# Patient Record
Sex: Male | Born: 1975 | Race: White | Hispanic: No | Marital: Married | Smoking: Never smoker
Health system: Southern US, Community
[De-identification: ages and names within clinical notes are randomized; demographics above are authoritative.]

## PROBLEM LIST (undated history)

## (undated) DIAGNOSIS — N200 Calculus of kidney: Secondary | ICD-10-CM

## (undated) DIAGNOSIS — L509 Urticaria, unspecified: Secondary | ICD-10-CM

## (undated) DIAGNOSIS — E7800 Pure hypercholesterolemia, unspecified: Secondary | ICD-10-CM

## (undated) DIAGNOSIS — I10 Essential (primary) hypertension: Secondary | ICD-10-CM

## (undated) HISTORY — DX: Urticaria, unspecified: L50.9

---

## 2000-08-26 ENCOUNTER — Emergency Department (HOSPITAL_COMMUNITY): Admission: EM | Admit: 2000-08-26 | Discharge: 2000-08-26 | Payer: Self-pay

## 2000-08-26 ENCOUNTER — Encounter: Payer: Self-pay | Admitting: Emergency Medicine

## 2001-01-28 ENCOUNTER — Emergency Department (HOSPITAL_COMMUNITY): Admission: EM | Admit: 2001-01-28 | Discharge: 2001-01-28 | Payer: Self-pay | Admitting: Emergency Medicine

## 2003-08-02 ENCOUNTER — Emergency Department (HOSPITAL_COMMUNITY): Admission: EM | Admit: 2003-08-02 | Discharge: 2003-08-03 | Payer: Self-pay | Admitting: Emergency Medicine

## 2004-10-01 ENCOUNTER — Ambulatory Visit: Payer: Self-pay | Admitting: Internal Medicine

## 2004-11-05 ENCOUNTER — Ambulatory Visit: Payer: Self-pay | Admitting: Internal Medicine

## 2005-04-22 ENCOUNTER — Ambulatory Visit: Payer: Self-pay | Admitting: Cardiology

## 2005-04-24 ENCOUNTER — Ambulatory Visit: Payer: Self-pay | Admitting: Internal Medicine

## 2008-10-24 ENCOUNTER — Emergency Department (HOSPITAL_BASED_OUTPATIENT_CLINIC_OR_DEPARTMENT_OTHER): Admission: EM | Admit: 2008-10-24 | Discharge: 2008-10-25 | Payer: Self-pay | Admitting: Emergency Medicine

## 2008-10-24 ENCOUNTER — Ambulatory Visit: Payer: Self-pay | Admitting: Diagnostic Radiology

## 2010-04-07 ENCOUNTER — Ambulatory Visit: Payer: Self-pay | Admitting: Diagnostic Radiology

## 2010-04-07 ENCOUNTER — Emergency Department (HOSPITAL_BASED_OUTPATIENT_CLINIC_OR_DEPARTMENT_OTHER): Admission: EM | Admit: 2010-04-07 | Discharge: 2010-04-07 | Payer: Self-pay | Admitting: Emergency Medicine

## 2010-06-17 ENCOUNTER — Encounter: Payer: Self-pay | Admitting: Emergency Medicine

## 2010-08-06 LAB — COMPREHENSIVE METABOLIC PANEL
Alkaline Phosphatase: 69 U/L (ref 39–117)
BUN: 22 mg/dL (ref 6–23)
Chloride: 107 mEq/L (ref 96–112)
Glucose, Bld: 101 mg/dL — ABNORMAL HIGH (ref 70–99)
Potassium: 4 mEq/L (ref 3.5–5.1)
Total Bilirubin: 0.8 mg/dL (ref 0.3–1.2)
Total Protein: 7.4 g/dL (ref 6.0–8.3)

## 2010-08-06 LAB — DIFFERENTIAL
Basophils Absolute: 0.1 10*3/uL (ref 0.0–0.1)
Basophils Relative: 1 % (ref 0–1)
Neutro Abs: 8.1 10*3/uL — ABNORMAL HIGH (ref 1.7–7.7)
Neutrophils Relative %: 75 % (ref 43–77)

## 2010-08-06 LAB — URINE MICROSCOPIC-ADD ON

## 2010-08-06 LAB — URINALYSIS, ROUTINE W REFLEX MICROSCOPIC
Nitrite: NEGATIVE
Protein, ur: NEGATIVE mg/dL
Urobilinogen, UA: 0.2 mg/dL (ref 0.0–1.0)

## 2010-08-06 LAB — CBC
HCT: 49.5 % (ref 39.0–52.0)
MCV: 90.5 fL (ref 78.0–100.0)
RDW: 11.4 % — ABNORMAL LOW (ref 11.5–15.5)
WBC: 10.9 10*3/uL — ABNORMAL HIGH (ref 4.0–10.5)

## 2010-09-02 LAB — POCT CARDIAC MARKERS: Troponin i, poc: <0.05 ng/mL (ref 0.00–0.09)

## 2010-09-02 LAB — COMPREHENSIVE METABOLIC PANEL
ALT: 89 U/L — ABNORMAL HIGH (ref 0–53)
Alkaline Phosphatase: 69 U/L (ref 39–117)
CO2: 25 mEq/L (ref 19–32)
GFR calc non Af Amer: >60 mL/min (ref >60)
Glucose, Bld: 97 mg/dL (ref 70–99)
Potassium: 4 mEq/L (ref 3.5–5.1)
Sodium: 140 mEq/L (ref 135–145)
Total Protein: 7.4 g/dL (ref 6.0–8.3)

## 2010-09-02 LAB — CBC
Hemoglobin: 17.7 g/dL — ABNORMAL HIGH (ref 13.0–17.0)
RBC: 5.83 MIL/uL — ABNORMAL HIGH (ref 4.22–5.81)
WBC: 7 10*3/uL (ref 4.0–10.5)

## 2010-09-02 LAB — DIFFERENTIAL
Basophils Relative: 0 % (ref 0–1)
Eosinophils Absolute: 0.1 10*3/uL (ref 0.0–0.7)
Monocytes Relative: 12 % (ref 3–12)
Neutrophils Relative %: 56 % (ref 43–77)

## 2010-09-02 LAB — RAPID STREP SCREEN (MED CTR MEBANE ONLY): Streptococcus, Group A Screen (Direct): NEGATIVE

## 2011-06-05 ENCOUNTER — Emergency Department (INDEPENDENT_AMBULATORY_CARE_PROVIDER_SITE_OTHER): Payer: 59

## 2011-06-05 ENCOUNTER — Encounter (HOSPITAL_BASED_OUTPATIENT_CLINIC_OR_DEPARTMENT_OTHER): Payer: Self-pay | Admitting: *Deleted

## 2011-06-05 ENCOUNTER — Emergency Department (HOSPITAL_BASED_OUTPATIENT_CLINIC_OR_DEPARTMENT_OTHER)
Admission: EM | Admit: 2011-06-05 | Discharge: 2011-06-05 | Disposition: A | Discharge status: Home / Self Care | Payer: 59 | Attending: Emergency Medicine | Admitting: Emergency Medicine

## 2011-06-05 DIAGNOSIS — R05 Cough: Secondary | ICD-10-CM

## 2011-06-05 DIAGNOSIS — J029 Acute pharyngitis, unspecified: Secondary | ICD-10-CM

## 2011-06-05 DIAGNOSIS — IMO0001 Reserved for inherently not codable concepts without codable children: Secondary | ICD-10-CM | POA: Insufficient documentation

## 2011-06-05 DIAGNOSIS — J069 Acute upper respiratory infection, unspecified: Secondary | ICD-10-CM | POA: Insufficient documentation

## 2011-06-05 DIAGNOSIS — R059 Cough, unspecified: Secondary | ICD-10-CM | POA: Insufficient documentation

## 2011-06-05 DIAGNOSIS — R0989 Other specified symptoms and signs involving the circulatory and respiratory systems: Secondary | ICD-10-CM

## 2011-06-05 HISTORY — DX: Calculus of kidney: N20.0

## 2011-06-05 MED ORDER — BENZONATATE 200 MG PO CAPS: 200.0000 mg | ORAL_CAPSULE | Freq: Three times a day (TID) | ORAL | Status: AC | PRN

## 2011-06-05 MED ORDER — HYDROCOD POLST-CHLORPHEN POLST 10-8 MG/5ML PO LQCR: 5.0000 mL | Freq: Two times a day (BID) | ORAL | Status: DC | PRN

## 2011-06-05 MED ORDER — BENZONATATE 100 MG PO CAPS
200.0000 mg | ORAL_CAPSULE | Freq: Once | ORAL | Status: AC
  Administered 2011-06-05: 200 mg via ORAL
  Filled 2011-06-05: qty 2

## 2011-06-05 NOTE — ED Provider Notes (Signed)
History     CSN: [id number]  Arrival date & time 06/05/11  8295   First MD Initiated Contact with Patient 06/05/11 [phone]      Chief Complaint  Patient presents with  . Cough    (Consider location/radiation/quality/duration/timing/severity/associated sxs/prior treatment) HPI Patient is a 36 year old male who presents today complaining of cough, nasal congestion, fevers, and myalgias for the past week. He has had numerous sick contacts. Patient did not have flu vaccine this year. He has no history of medical problems. He reports that his wife made him come today as he was coughing so much she can no longer sleep. He's tried delsym for his symptoms without relief.  There are no other associated or modifying factors. Past Medical History  Diagnosis Date  . Kidney stones     History reviewed. No pertinent past surgical history.  History reviewed. No pertinent family history.  History  Substance Use Topics  . Smoking status: Never Smoker   . Smokeless tobacco: Not on file  . Alcohol Use: No      Review of Systems  Constitutional: Positive for fever, chills and fatigue.  HENT: Positive for congestion, sore throat, rhinorrhea and sneezing.   Respiratory: Positive for cough.   Cardiovascular: Negative.   Gastrointestinal: Negative.   Genitourinary: Negative.   Musculoskeletal: Positive for myalgias.  Skin: Negative.   Neurological: Positive for headaches.  Hematological: Negative.   Psychiatric/Behavioral: Negative.   All other systems reviewed and are negative.    Allergies  Aspirin and Penicillins  Home Medications   Current Outpatient Rx  Name Route Sig Dispense Refill  . BENZONATATE 200 MG PO CAPS Oral Take 1 capsule (200 mg total) by mouth 3 (three) times daily as needed for cough. 30 capsule 0  . HYDROCOD POLST-CPM POLST ER 10-8 MG/5ML PO LQCR Oral Take 5 mLs by mouth every 12 (twelve) hours as needed. 140 mL 0    BP 134/94  Pulse 92  Temp(Src) 98 F (36.7  C) (Oral)  Resp 19  Ht 5\' 8"  (1.727 m)  Wt 190 lb (86.183 kg)  BMI 28.89 kg/m2  SpO2 97%  Physical Exam  Nursing note and vitals reviewed. Constitutional: He is oriented to person, place, and time. He appears well-developed and well-nourished. No distress.  HENT:  Head: Normocephalic and atraumatic.  Nose: Mucosal edema present.  Mouth/Throat: Posterior oropharyngeal erythema present.  Eyes: Conjunctivae and EOM are normal. Pupils are equal, round, and reactive to light.  Neck: Normal range of motion.  Cardiovascular: Normal rate, regular rhythm, normal heart sounds and intact distal pulses.  Exam reveals no gallop and no friction rub.   No murmur heard. Pulmonary/Chest: Effort normal and breath sounds normal. No respiratory distress. He has no wheezes. He has no rales.  Abdominal: Soft. Bowel sounds are normal. He exhibits no distension. There is no tenderness. There is no rebound and no guarding.  Musculoskeletal: Normal range of motion. He exhibits no edema and no tenderness.  Neurological: He is alert and oriented to person, place, and time. No cranial nerve deficit. He exhibits normal muscle tone. Coordination normal.  Skin: Skin is warm and dry. No rash noted.  Psychiatric: He has a normal mood and affect.    ED Course  Procedures (including critical care time)  Labs Reviewed - No data to display Dg Chest 2 View  06/05/2011  *RADIOLOGY REPORT*  Clinical Data: Cough.  Congestion.  Sore throat.  CHEST - 2 VIEW  Comparison: 10/24/2008.  Findings:  Cardiopericardial  silhouette within normal limits. Mediastinal contours normal. Trachea midline.  No airspace disease or effusion.  IMPRESSION: Negative.  Original Report Authenticated By: Andreas Newport, M.D.     1. URI (upper respiratory infection)       MDM  Patient was evaluated for presentation with flulike illness. He was given Jerilynn Som here in the emergency department for his cough. Chest x-ray was performed and  was negative. Patient was discharged prescriptions for both Tessalon Perles and Tussionex cough syrup. He is encouraged to drink lots of fluid intake over-the-counter Tylenol or ibuprofen on a scheduled basis while symptoms persist. He was told that his symptoms could last up to 7-10 days and that he is on day 6 of illness based on his history. Patient stated understanding and he was discharged in good condition.        Cyndra Numbers, MD 06/05/11 [phone]

## 2011-06-05 NOTE — ED Notes (Signed)
C/o cough and flu sxs for several days

## 2011-06-05 NOTE — ED Notes (Signed)
MD at bedside. 

## 2012-02-24 ENCOUNTER — Encounter (HOSPITAL_BASED_OUTPATIENT_CLINIC_OR_DEPARTMENT_OTHER): Payer: Self-pay | Admitting: *Deleted

## 2012-02-24 ENCOUNTER — Emergency Department (HOSPITAL_BASED_OUTPATIENT_CLINIC_OR_DEPARTMENT_OTHER)
Admission: EM | Admit: 2012-02-24 | Discharge: 2012-02-24 | Disposition: A | Discharge status: Home / Self Care | Payer: 59 | Attending: Emergency Medicine | Admitting: Emergency Medicine

## 2012-02-24 DIAGNOSIS — X58XXXA Exposure to other specified factors, initial encounter: Secondary | ICD-10-CM | POA: Insufficient documentation

## 2012-02-24 DIAGNOSIS — T7840XA Allergy, unspecified, initial encounter: Secondary | ICD-10-CM | POA: Insufficient documentation

## 2012-02-24 DIAGNOSIS — Z880 Allergy status to penicillin: Secondary | ICD-10-CM | POA: Insufficient documentation

## 2012-02-24 DIAGNOSIS — Z87442 Personal history of urinary calculi: Secondary | ICD-10-CM | POA: Insufficient documentation

## 2012-02-24 DIAGNOSIS — E7800 Pure hypercholesterolemia, unspecified: Secondary | ICD-10-CM | POA: Insufficient documentation

## 2012-02-24 DIAGNOSIS — Z886 Allergy status to analgesic agent status: Secondary | ICD-10-CM | POA: Insufficient documentation

## 2012-02-24 HISTORY — DX: Pure hypercholesterolemia, unspecified: E78.00

## 2012-02-24 MED ORDER — SODIUM CHLORIDE 0.9 % IV SOLN
Freq: Once | INTRAVENOUS | Status: AC
  Administered 2012-02-24: 17:00:00 via INTRAVENOUS

## 2012-02-24 MED ORDER — PREDNISONE 20 MG PO TABS: 60.0000 mg | ORAL_TABLET | Freq: Every day | ORAL | Status: DC

## 2012-02-24 MED ORDER — METHYLPREDNISOLONE SODIUM SUCC 125 MG IJ SOLR
125.0000 mg | Freq: Once | INTRAMUSCULAR | Status: AC
  Administered 2012-02-24: 125 mg via INTRAVENOUS
  Filled 2012-02-24: qty 2

## 2012-02-24 MED ORDER — FAMOTIDINE IN NACL 20-0.9 MG/50ML-% IV SOLN
20.0000 mg | Freq: Once | INTRAVENOUS | Status: AC
  Administered 2012-02-24: 20 mg via INTRAVENOUS
  Filled 2012-02-24: qty 50

## 2012-02-24 MED ORDER — FAMOTIDINE 20 MG PO TABS: 20.0000 mg | ORAL_TABLET | Freq: Two times a day (BID) | ORAL | Status: DC

## 2012-02-24 MED ORDER — DIPHENHYDRAMINE HCL 50 MG/ML IJ SOLN
25.0000 mg | Freq: Once | INTRAMUSCULAR | Status: AC
  Administered 2012-02-24: 50 mg via INTRAVENOUS
  Filled 2012-02-24: qty 1

## 2012-02-24 MED ORDER — DIPHENHYDRAMINE HCL 25 MG PO TABS: 25.0000 mg | ORAL_TABLET | Freq: Four times a day (QID) | ORAL | Status: DC

## 2012-02-24 NOTE — ED Notes (Signed)
Shaking all over. Hives. Skin is burning. Started after eating fish. He took Benadryl 25mg   20 mins ago. No trouble breathing or swallowing at this time.

## 2012-02-24 NOTE — ED Provider Notes (Signed)
History     CSN: [id number]  Arrival date & time 02/24/12  1552   First MD Initiated Contact with Patient 02/24/12 1614      Chief Complaint  Patient presents with  . Allergic Reaction    (Consider location/radiation/quality/duration/timing/severity/associated sxs/prior treatment) HPI Pt reports he was at work earlier today when he had sudden onset of body tingling, redness, hives and itching. This was about an hour after he ate a lunch of leftover fish, pork, cabbage and several other foods which he has had before without any difficulty. He denies any throat or tongue swelling. No shortness of breath or difficulty swallowing. He took a single 25mg  benadryl at work prior to arrival with some improvement.   Past Medical History  Diagnosis Date  . Kidney stones   . High cholesterol     History reviewed. No pertinent past surgical history.  No family history on file.  History  Substance Use Topics  . Smoking status: Never Smoker   . Smokeless tobacco: Not on file  . Alcohol Use: No      Review of Systems All other systems reviewed and are negative except as noted in HPI.   Allergies  Aspirin and Penicillins  Home Medications   Current Outpatient Rx  Name Route Sig Dispense Refill  . HYDROCOD POLST-CPM POLST ER 10-8 MG/5ML PO LQCR Oral Take 5 mLs by mouth every 12 (twelve) hours as needed. 140 mL 0    BP 154/93  Pulse 96  Temp 98 F (36.7 C) (Oral)  Resp 20  SpO2 100%  Physical Exam  Nursing note and vitals reviewed. Constitutional: He is oriented to person, place, and time. He appears well-developed and well-nourished.  HENT:  Head: Normocephalic and atraumatic.       No tongue or lip swelling  Eyes: EOM are normal. Pupils are equal, round, and reactive to light.  Neck: Normal range of motion. Neck supple.  Cardiovascular: Normal rate, normal heart sounds and intact distal pulses.   Pulmonary/Chest: Effort normal and breath sounds normal. No stridor.    Abdominal: Bowel sounds are normal. He exhibits no distension. There is no tenderness.  Musculoskeletal: Normal range of motion. He exhibits no edema and no tenderness.  Neurological: He is alert and oriented to person, place, and time. He has normal strength. No cranial nerve deficit or sensory deficit.  Skin: Skin is warm and dry. Rash (diffuse erythema without hives) noted.  Psychiatric: He has a normal mood and affect.    ED Course  Procedures (including critical care time)  Labs Reviewed - No data to display No results found.   No diagnosis found.    MDM  Symptoms resolved. Pt has not had any airway compromise. Suspect scombroid vs other food allergy.         Cartrell Bentsen B. Bernette Mayers, MD 02/24/12 1757

## 2014-05-22 ENCOUNTER — Emergency Department (HOSPITAL_BASED_OUTPATIENT_CLINIC_OR_DEPARTMENT_OTHER)
Admission: EM | Admit: 2014-05-22 | Discharge: 2014-05-22 | Disposition: A | Discharge status: Home / Self Care | Payer: 59 | Attending: Emergency Medicine | Admitting: Emergency Medicine

## 2014-05-22 ENCOUNTER — Encounter (HOSPITAL_BASED_OUTPATIENT_CLINIC_OR_DEPARTMENT_OTHER): Payer: Self-pay

## 2014-05-22 DIAGNOSIS — Z8639 Personal history of other endocrine, nutritional and metabolic disease: Secondary | ICD-10-CM | POA: Insufficient documentation

## 2014-05-22 DIAGNOSIS — R05 Cough: Secondary | ICD-10-CM | POA: Diagnosis present

## 2014-05-22 DIAGNOSIS — Z87442 Personal history of urinary calculi: Secondary | ICD-10-CM | POA: Insufficient documentation

## 2014-05-22 DIAGNOSIS — I10 Essential (primary) hypertension: Secondary | ICD-10-CM | POA: Diagnosis not present

## 2014-05-22 DIAGNOSIS — Z7952 Long term (current) use of systemic steroids: Secondary | ICD-10-CM | POA: Diagnosis not present

## 2014-05-22 DIAGNOSIS — Z880 Allergy status to penicillin: Secondary | ICD-10-CM | POA: Diagnosis not present

## 2014-05-22 DIAGNOSIS — J069 Acute upper respiratory infection, unspecified: Secondary | ICD-10-CM | POA: Insufficient documentation

## 2014-05-22 DIAGNOSIS — Z79899 Other long term (current) drug therapy: Secondary | ICD-10-CM | POA: Diagnosis not present

## 2014-05-22 HISTORY — DX: Essential (primary) hypertension: I10

## 2014-05-22 LAB — RAPID STREP SCREEN (MED CTR MEBANE ONLY): STREPTOCOCCUS, GROUP A SCREEN (DIRECT): NEGATIVE

## 2014-05-22 MED ORDER — BENZONATATE 100 MG PO CAPS
200.0000 mg | ORAL_CAPSULE | Freq: Once | ORAL | Status: AC
  Administered 2014-05-22: 200 mg via ORAL
  Filled 2014-05-22: qty 2

## 2014-05-22 MED ORDER — GI COCKTAIL ~~LOC~~
30.0000 mL | Freq: Once | ORAL | Status: AC
  Administered 2014-05-22: 30 mL via ORAL
  Filled 2014-05-22: qty 30

## 2014-05-22 MED ORDER — DESLORATADINE 5 MG PO TABS: 5.0000 mg | ORAL_TABLET | Freq: Every day | ORAL | Status: DC

## 2014-05-22 MED ORDER — HYDROCOD POLST-CHLORPHEN POLST 10-8 MG/5ML PO LQCR: 5.0000 mL | Freq: Every evening | ORAL | Status: DC | PRN

## 2014-05-22 MED ORDER — BENZONATATE 100 MG PO CAPS: 100.0000 mg | ORAL_CAPSULE | Freq: Three times a day (TID) | ORAL | Status: DC

## 2014-05-22 NOTE — Discharge Instructions (Signed)
Cool Mist Vaporizers °Vaporizers may help relieve the symptoms of a cough and cold. They add moisture to the air, which helps mucus to become thinner and less sticky. This makes it easier to breathe and cough up secretions. Cool mist vaporizers do not cause serious burns like hot mist vaporizers, which may also be called steamers or humidifiers. Vaporizers have not been proven to help with colds. You should not use a vaporizer if you are allergic to mold. °HOME CARE INSTRUCTIONS °· Follow the package instructions for the vaporizer. °· Do not use anything other than distilled water in the vaporizer. °· Do not run the vaporizer all of the time. This can cause mold or bacteria to grow in the vaporizer. °· Clean the vaporizer after each time it is used. °· Clean and dry the vaporizer well before storing it. °· Stop using the vaporizer if worsening respiratory symptoms develop. °Document Released: 02/07/2004 Document Revised: 05/17/2013 Document Reviewed: 09/29/2012 °Exit[employer]® Patient Information ©2015 [employer]. This information is not intended to replace advice given to you by your health care provider. Make sure you discuss any questions you have with your health care provider. ° °

## 2014-05-22 NOTE — ED Provider Notes (Signed)
CSN: [id number]     Arrival date & time 05/22/14  0235 History   First MD Initiated Contact with Patient 05/22/14 0250     Chief Complaint  Patient presents with  . Cough     (Consider location/radiation/quality/duration/timing/severity/associated sxs/prior Treatment) Patient is a 38 y.o. male presenting with cough. The history is provided by the patient.  Cough Cough characteristics:  Non-productive Severity:  Moderate Onset quality:  Gradual Duration:  1 day Timing:  Intermittent Progression:  Unchanged Chronicity:  Recurrent Smoker: no   Context: upper respiratory infection   Context: not animal exposure   Relieved by:  Nothing Worsened by:  Nothing tried Ineffective treatments:  None tried Associated symptoms: sore throat   Associated symptoms: no fever, no shortness of breath, no weight loss and no wheezing   Risk factors: no recent travel     Past Medical History  Diagnosis Date  . Kidney stones   . High cholesterol   . Hypertension    History reviewed. No pertinent past surgical history. History reviewed. No pertinent family history. History  Substance Use Topics  . Smoking status: Never Smoker   . Smokeless tobacco: Not on file  . Alcohol Use: No    Review of Systems  Constitutional: Negative for fever and weight loss.  HENT: Positive for sore throat. Negative for congestion, drooling, trouble swallowing and voice change.   Respiratory: Positive for cough. Negative for shortness of breath and wheezing.   All other systems reviewed and are negative.     Allergies  Amoxicillin; Aspirin; Nsaids; and Penicillins  Home Medications   Prior to Admission medications   Medication Sig Start Date End Date Taking? Authorizing Provider  chlorpheniramine-HYDROcodone (TUSSIONEX PENNKINETIC ER) 10-8 MG/5ML LQCR Take 5 mLs by mouth every 12 (twelve) hours as needed. 06/05/11   Cyndra NumbersMeagan Hunt, MD  diphenhydrAMINE (BENADRYL) 25 MG tablet Take 1 tablet (25 mg total) by  mouth every 6 (six) hours. 02/24/12   Charles B. Bernette MayersSheldon, MD  famotidine (PEPCID) 20 MG tablet Take 1 tablet (20 mg total) by mouth 2 (two) times daily. 02/24/12   Charles B. Bernette MayersSheldon, MD  predniSONE (DELTASONE) 20 MG tablet Take 3 tablets (60 mg total) by mouth daily. 02/24/12   Charles B. Bernette MayersSheldon, MD   BP 129/97 mmHg  Pulse 89  Temp(Src) 98.1 F (36.7 C) (Oral)  Resp 20  Ht 5' 8.5" (1.74 m)  Wt 190 lb (86.183 kg)  BMI 28.47 kg/m2  SpO2 99% Physical Exam  Constitutional: He is oriented to person, place, and time. He appears well-developed and well-nourished. No distress.  HENT:  Head: Normocephalic and atraumatic.  Mouth/Throat: Oropharynx is clear and moist. No oropharyngeal exudate.  Eyes: Conjunctivae are normal. Pupils are equal, round, and reactive to light.  Neck: Normal range of motion. Neck supple.  Cardiovascular: Normal rate, regular rhythm and intact distal pulses.   Pulmonary/Chest: Effort normal and breath sounds normal. No respiratory distress. He has no wheezes. He has no rales.  Abdominal: Soft. Bowel sounds are normal. There is no tenderness. There is no rebound and no guarding.  Musculoskeletal: Normal range of motion.  Lymphadenopathy:    He has no cervical adenopathy.  Neurological: He is alert and oriented to person, place, and time.  Skin: Skin is warm and dry.  Psychiatric: He has a normal mood and affect.    ED Course  Procedures (including critical care time) Labs Review Labs Reviewed  RAPID STREP SCREEN    Imaging Review No results found.  EKG Interpretation None      MDM   Final diagnoses:  None    Lung exam is clear and symptoms only going on for 24 hours.  Good specimen by me and strep is negative will treat symptomatically.  Follow up with your family doctor     Darryl Willner K Gwenetta Devos-Rasch, MD 05/22/14 [phone]

## 2014-05-22 NOTE — ED Notes (Signed)
Pt reports cough and sore throat for 2 days. 

## 2014-05-24 LAB — CULTURE, GROUP A STREP

## 2015-07-09 DIAGNOSIS — E781 Pure hyperglyceridemia: Secondary | ICD-10-CM | POA: Insufficient documentation

## 2015-07-09 DIAGNOSIS — E8881 Metabolic syndrome: Secondary | ICD-10-CM | POA: Insufficient documentation

## 2015-07-09 DIAGNOSIS — IMO0002 Reserved for concepts with insufficient information to code with codable children: Secondary | ICD-10-CM | POA: Insufficient documentation

## 2015-07-09 DIAGNOSIS — J3089 Other allergic rhinitis: Secondary | ICD-10-CM | POA: Insufficient documentation

## 2015-07-09 DIAGNOSIS — T7800XA Anaphylactic reaction due to unspecified food, initial encounter: Secondary | ICD-10-CM | POA: Insufficient documentation

## 2015-07-09 DIAGNOSIS — E663 Overweight: Secondary | ICD-10-CM | POA: Insufficient documentation

## 2015-07-09 DIAGNOSIS — R740 Nonspecific elevation of levels of transaminase and lactic acid dehydrogenase [LDH]: Secondary | ICD-10-CM

## 2015-07-09 DIAGNOSIS — E786 Lipoprotein deficiency: Secondary | ICD-10-CM | POA: Insufficient documentation

## 2016-11-11 DIAGNOSIS — I10 Essential (primary) hypertension: Secondary | ICD-10-CM | POA: Insufficient documentation

## 2017-05-28 ENCOUNTER — Ambulatory Visit (HOSPITAL_BASED_OUTPATIENT_CLINIC_OR_DEPARTMENT_OTHER)
Admission: RE | Admit: 2017-05-28 | Discharge: 2017-05-28 | Disposition: A | Discharge status: Home / Self Care | Payer: Managed Care, Other (non HMO) | Source: Ambulatory Visit | Attending: Family Medicine | Admitting: Family Medicine

## 2017-05-28 ENCOUNTER — Encounter: Payer: Self-pay | Admitting: Family Medicine

## 2017-05-28 ENCOUNTER — Ambulatory Visit (INDEPENDENT_AMBULATORY_CARE_PROVIDER_SITE_OTHER): Payer: Managed Care, Other (non HMO) | Admitting: Family Medicine

## 2017-05-28 VITALS — BP 118/81 | HR 94 | Ht 69.0 in | Wt 197.0 lb

## 2017-05-28 DIAGNOSIS — M25531 Pain in right wrist: Secondary | ICD-10-CM | POA: Insufficient documentation

## 2017-05-28 NOTE — Patient Instructions (Signed)
Your x-rays look great. You have an ulnar tendinitis of your wrist. Wear the wrist brace as often as possible during the day (consider wearing when sleeping too). Icing 15 minutes at a time 3-4 times a day. Consider topical medication like capsaicin, biofreeze, aspercreme up to 4 times a day to help with this as well. Tylenol 500mg  1-2 tabs three times a day as needed for pain. Follow up with me in 1 month or as needed if you're doing well.

## 2017-06-02 ENCOUNTER — Encounter: Payer: Self-pay | Admitting: Family Medicine

## 2017-06-02 DIAGNOSIS — M25531 Pain in right wrist: Secondary | ICD-10-CM | POA: Insufficient documentation

## 2017-06-02 NOTE — Progress Notes (Signed)
PCP: System, Pcp Not In  Subjective:   HPI: Patient is a 42 y.o. male here for right wrist pain.  Patient reports he's had about 1 week of right wrist pain. No acute injury or trauma. He works on a [employer] and carries a heavy backpack - thinks these may have contributed to his pain. Has tried using a wrist brace. Some local swelling. Pain is improving some down to 2/10 level, more dull on ulnar side. No prior issues. Right handed. No skin changes, numbness.   Past Medical History:  Diagnosis Date  . High cholesterol   . Hypertension   . Kidney stones     Current Outpatient Medications on File Prior to Visit  Medication Sig Dispense Refill  . EPINEPHrine 0.3 mg/0.3 mL IJ SOAJ injection Inject into the muscle.    . valsartan (DIOVAN) 160 MG tablet   4   No current facility-administered medications on file prior to visit.     History reviewed. No pertinent surgical history.  Allergies  Allergen Reactions  . Amoxicillin     Hives   . Aspirin   . Nsaids     Hives   . Olmesartan Nausea Only    Chest pain  . Penicillins   . Tolmetin Rash    Hives    Social History   Socioeconomic History  . Marital status: Married    Spouse name: Not on file  . Number of children: Not on file  . Years of education: Not on file  . Highest education level: Not on file  Social Needs  . Financial resource strain: Not on file  . Food insecurity - worry: Not on file  . Food insecurity - inability: Not on file  . Transportation needs - medical: Not on file  . Transportation needs - non-medical: Not on file  Occupational History  . Not on file  Tobacco Use  . Smoking status: Never Smoker  . Smokeless tobacco: Never Used  Substance and Sexual Activity  . Alcohol use: No  . Drug use: No  . Sexual activity: Not on file  Other Topics Concern  . Not on file  Social History Narrative  . Not on file    History reviewed. No pertinent family history.  BP 118/81   Pulse 94    Ht 5\' 9"  (1.753 m)   Wt 197 lb (89.4 kg)   BMI 29.09 kg/m   Review of Systems: See HPI above.     Objective:  Physical Exam:  Gen: NAD, comfortable in exam room  Right wrist: No gross deformity, swelling, bruising. No TTP including TFCC, carpal tunnel, 1st dorsal compartment, 1st CMC joint.  Points to distal ulna as area of pain though. FROM with pain on extension and ulnar deviaion. Strength 5/5 motions of wrist and digits. NVI distally.  Left wrist: No deformity. No tenderness. FROM with 5/5 strength. NVI distally. Left wrist:   Assessment & Plan:  1. Right wrist pain - 2/2 ulnar tendinitis.  Start with wrist brace, icing, tylenol, topical medications (cannot take NSAIDs).  F/u in 1 month or prn if doing well.

## 2017-06-02 NOTE — Assessment & Plan Note (Signed)
2/2 ulnar tendinitis.  Start with wrist brace, icing, tylenol, topical medications (cannot take NSAIDs).  F/u in 1 month or prn if doing well.

## 2018-05-06 ENCOUNTER — Ambulatory Visit (INDEPENDENT_AMBULATORY_CARE_PROVIDER_SITE_OTHER): Payer: Managed Care, Other (non HMO) | Admitting: Allergy and Immunology

## 2018-05-06 ENCOUNTER — Encounter: Payer: Self-pay | Admitting: Allergy and Immunology

## 2018-05-06 VITALS — BP 126/80 | HR 87 | Temp 97.9°F | Ht 68.27 in | Wt 196.0 lb

## 2018-05-06 DIAGNOSIS — H1010 Acute atopic conjunctivitis, unspecified eye: Secondary | ICD-10-CM | POA: Insufficient documentation

## 2018-05-06 DIAGNOSIS — J3089 Other allergic rhinitis: Secondary | ICD-10-CM | POA: Diagnosis not present

## 2018-05-06 DIAGNOSIS — L500 Allergic urticaria: Secondary | ICD-10-CM

## 2018-05-06 DIAGNOSIS — H1013 Acute atopic conjunctivitis, bilateral: Secondary | ICD-10-CM | POA: Diagnosis not present

## 2018-05-06 DIAGNOSIS — T7800XD Anaphylactic reaction due to unspecified food, subsequent encounter: Secondary | ICD-10-CM

## 2018-05-06 MED ORDER — EPINEPHRINE 0.3 MG/0.3ML IJ SOAJ: 0.3000 mg | Freq: Once | INTRAMUSCULAR | 2 refills | Status: AC

## 2018-05-06 MED ORDER — OLOPATADINE HCL 0.2 % OP SOLN: 1.0000 [drp] | Freq: Every day | OPHTHALMIC | 5 refills | Status: DC | PRN

## 2018-05-06 MED ORDER — FLUTICASONE PROPIONATE 50 MCG/ACT NA SUSP: 1.0000 | Freq: Every day | NASAL | 5 refills | Status: DC | PRN

## 2018-05-06 MED ORDER — LEVOCETIRIZINE DIHYDROCHLORIDE 5 MG PO TABS: 5.0000 mg | ORAL_TABLET | Freq: Every day | ORAL | 5 refills | Status: DC | PRN

## 2018-05-06 NOTE — Assessment & Plan Note (Signed)
The patient's history suggests shellfish allergy and positive skin test results today confirm this diagnosis.  Meticulous avoidance of shellfish as discussed.  A refill prescription has been provided for epinephrine auto-injector 2 pack along with instructions for proper administration.  A food allergy action plan has been provided and discussed.  Medic Alert identification is recommended. 

## 2018-05-06 NOTE — Patient Instructions (Addendum)
Pruritus/urticaria  Instructions have been discussed and provided for H1/H2 receptor blockade with titration to find lowest effective dose.  A prescription has been provided for levocetirizine, 5 mg daily as needed.  If possible, attempt to avoid offending substances.  If this problem persists or progresses, we may proceed to product patch testing.  Seasonal and perennial allergic rhinitis  Aeroallergen avoidance measures have been discussed and provided in written form.  Levocetirizine has been prescribed (as above).  A prescription has been provided for fluticasone nasal spray, one spray per nostril 1-2 times daily as needed. Proper nasal spray technique has been discussed and demonstrated.  Nasal saline spray (i.e. Simply Saline) is recommended prior to medicated nasal sprays and as needed.  The risks and benefits of aeroallergen immunotherapy have been discussed. The patient is interested in the possibility of initiating immunotherapy if insurance coverage is favorable. He will let us know how he would like to proceed.  Allergic conjunctivitis  Treatment plan as outlined above for allergic rhinitis.  A prescription has been provided for Pataday, one drop per eye daily as needed.  I have also recommended eye lubricant drops (i.e., Natural Tears) if needed.  Food allergy The patient's history suggests shellfish allergy and positive skin test results today confirm this diagnosis.  Meticulous avoidance of shellfish as discussed.  A refill prescription has been provided for epinephrine auto-injector 2 pack along with instructions for proper administration.  A food allergy action plan has been provided and discussed.  Medic Alert identification is recommended.   Return in about 4 months (around 09/05/2018), or if symptoms worsen or fail to improve.   Urticaria (Hives)  . Levocetirizine (Xyzal) 5 mg twice a day and famotidine (Pepcid) 20 mg twice a day. If no symptoms for  7-14 days then decrease to. . Levocetirizine (Xyzal) 5 mg twice a day and famotidine (Pepcid) 20 mg once a day.  If no symptoms for 7-14 days then decrease to. . Levocetirizine (Xyzal) 5 mg twice a day.  If no symptoms for 7-14 days then decrease to. . Levocetirizine (Xyzal) 5 mg once a day.  May use Benadryl (diphenhydramine) as needed for breakthrough symptoms       If symptoms return, then step up dosage

## 2018-05-06 NOTE — Assessment & Plan Note (Addendum)
   Aeroallergen avoidance measures have been discussed and provided in written form.  Levocetirizine has been prescribed (as above).  A prescription has been provided for fluticasone nasal spray, one spray per nostril 1-2 times daily as needed. Proper nasal spray technique has been discussed and demonstrated.  Nasal saline spray (i.e. Simply Saline) is recommended prior to medicated nasal sprays and as needed.  The risks and benefits of aeroallergen immunotherapy have been discussed. The patient is interested in the possibility of initiating immunotherapy if insurance coverage is favorable. He will let us know how he would like to proceed.

## 2018-05-06 NOTE — Assessment & Plan Note (Addendum)
   Treatment plan as outlined above for allergic rhinitis.  A prescription has been provided for Pataday, one drop per eye daily as needed.  I have also recommended eye lubricant drops (i.e., Natural Tears) if needed. 

## 2018-05-06 NOTE — Assessment & Plan Note (Signed)
   Instructions have been discussed and provided for H1/H2 receptor blockade with titration to find lowest effective dose.  A prescription has been provided for levocetirizine, 5 mg daily as needed.  If possible, attempt to avoid offending substances.  If this problem persists or progresses, we may proceed to product patch testing.

## 2018-05-06 NOTE — Progress Notes (Signed)
New Patient Note  RE: Adam Pace MRN: [id number] DOB: 1975-11-05 Date of Office Visit: 05/06/2018  Referring provider: No ref. provider found Primary care provider: Loyal Jacobson, MD  Chief Complaint: Pruritus; Urticaria; and Allergic Rhinitis    History of present illness: Adam Pace is a 42 y.o. male presenting today for evaluation.  Over this past year he has started to experience generalized pruritus when he is around strong perfumes, colognes, scented soaps, scented lotions, detergents, and chemicals.  He does not have to be in contact with the scented material, just in the presence of the aroma.  He is uncertain but believes that he may occasionally develop hives in association with the pruritus.  The symptoms resolve when he leaves the vicinity of the aroma and/or takes diphenhydramine.  He does not experience concomitant angioedema, cardiopulmonary symptoms, or GI symptoms.  He reports that his daughter had a similar problem until she had her environmental allergies treated with immunotherapy injections and the problem resolved along with improvement of her nasal and ocular symptoms. Adam Pace experiences nasal congestion, rhinorrhea, sneezing, postnasal drainage, nasal pruritus, and ocular pruritus.  These symptoms occur year around but are more frequent and severe with pollen exposure.  He attempts to control the symptoms with cetirizine as needed. Sholom reports that on 2 occasions approximately 4 to 5 years ago he consumed crawfish and within minutes developed pruritus and swelling of the lips.  He did not experience concomitant urticaria, cardiopulmonary symptoms, or GI symptoms.  Blood work in 2014 revealed serum specific IgE elevation to shellfish. He has an epinephrine autoinjector but is uncertain if it has expired.  Assessment and plan: Pruritus/urticaria  Instructions have been discussed and provided for H1/H2 receptor blockade with titration to find lowest effective  dose.  A prescription has been provided for levocetirizine, 5 mg daily as needed.  If possible, attempt to avoid offending substances.  If this problem persists or progresses, we may proceed to product patch testing.  Seasonal and perennial allergic rhinitis  Aeroallergen avoidance measures have been discussed and provided in written form.  Levocetirizine has been prescribed (as above).  A prescription has been provided for fluticasone nasal spray, one spray per nostril 1-2 times daily as needed. Proper nasal spray technique has been discussed and demonstrated.  Nasal saline spray (i.e. Simply Saline) is recommended prior to medicated nasal sprays and as needed.  The risks and benefits of aeroallergen immunotherapy have been discussed. The patient is interested in the possibility of initiating immunotherapy if insurance coverage is favorable. He will let us know how he would like to proceed.  Allergic conjunctivitis  Treatment plan as outlined above for allergic rhinitis.  A prescription has been provided for Pataday, one drop per eye daily as needed.  I have also recommended eye lubricant drops (i.e., Natural Tears) if needed.  Food allergy The patient's history suggests shellfish allergy and positive skin test results today confirm this diagnosis.  Meticulous avoidance of shellfish as discussed.  A refill prescription has been provided for epinephrine auto-injector 2 pack along with instructions for proper administration.  A food allergy action plan has been provided and discussed.  Medic Alert identification is recommended.   Meds ordered this encounter  Medications  . levocetirizine (XYZAL) 5 MG tablet    Sig: Take 1 tablet (5 mg total) by mouth daily as needed for allergies.    Dispense:  30 tablet    Refill:  5  . fluticasone (FLONASE) 50 MCG/ACT nasal spray  Sig: Place 1 spray into both nostrils daily as needed for allergies or rhinitis.    Dispense:  16 g     Refill:  5  . Olopatadine HCl (PATADAY) 0.2 % SOLN    Sig: Place 1 drop into both eyes daily as needed.    Dispense:  1 Bottle    Refill:  5  . EPINEPHrine 0.3 mg/0.3 mL IJ SOAJ injection    Sig: Inject 0.3 mLs (0.3 mg total) into the muscle once for 1 dose.    Dispense:  1 Device    Refill:  2    Diagnostics: Environmental skin testing: Positive to grass pollen, weed pollen, ragweed pollen, tree pollen, cat hair, cockroach antigen, and dust mite antigen. Food allergen skin testing: Positive to shrimp, crab, lobster, and scallop.    Physical examination: Blood pressure 126/80, pulse 87, temperature 97.9 F (36.6 C), temperature source Oral, height 5' 8.27" (1.734 m), weight 196 lb (88.9 kg), SpO2 99 %.  General: Alert, interactive, in no acute distress. HEENT: TMs pearly gray, turbinates moderately edematous with clear discharge, post-pharynx erythematous. Neck: Supple without lymphadenopathy. Lungs: Clear to auscultation without wheezing, rhonchi or rales. CV: Normal S1, S2 without murmurs. Abdomen: Nondistended, nontender. Skin: Warm and dry, without lesions or rashes. Extremities:  No clubbing, cyanosis or edema. Neuro:   Grossly intact.  Review of systems:  Review of systems negative except as noted in HPI / PMHx or noted below: Review of Systems  Constitutional: Negative.   HENT: Negative.   Eyes: Negative.   Respiratory: Negative.   Cardiovascular: Negative.   Gastrointestinal: Negative.   Genitourinary: Negative.   Musculoskeletal: Negative.   Skin: Negative.   Neurological: Negative.   Endo/Heme/Allergies: Negative.   Psychiatric/Behavioral: Negative.     Past medical history:  Past Medical History:  Diagnosis Date  . High cholesterol   . Hypertension   . Kidney stones   . Urticaria     Past surgical history:  History reviewed. No pertinent surgical history.  Family history: Family History  Problem Relation Age of Onset  . Allergic rhinitis Sister    . Asthma Neg Hx   . Eczema Neg Hx   . Urticaria Neg Hx     Social history: Social History   Socioeconomic History  . Marital status: Married    Spouse name: Not on file  . Number of children: Not on file  . Years of education: Not on file  . Highest education level: Not on file  Occupational History  . Not on file  Social Needs  . Financial resource strain: Not on file  . Food insecurity:    Worry: Not on file    Inability: Not on file  . Transportation needs:    Medical: Not on file    Non-medical: Not on file  Tobacco Use  . Smoking status: Never Smoker  . Smokeless tobacco: Never Used  Substance and Sexual Activity  . Alcohol use: No  . Drug use: No  . Sexual activity: Not on file  Lifestyle  . Physical activity:    Days per week: Not on file    Minutes per session: Not on file  . Stress: Not on file  Relationships  . Social connections:    Talks on phone: Not on file    Gets together: Not on file    Attends religious service: Not on file    Active member of club or organization: Not on file    Attends meetings of  clubs or organizations: Not on file    Relationship status: Not on file  . Intimate partner violence:    Fear of current or ex partner: Not on file    Emotionally abused: Not on file    Physically abused: Not on file    Forced sexual activity: Not on file  Other Topics Concern  . Not on file  Social History Narrative  . Not on file   Environmental History: The patient lives in a 44 year old house with hardwood floors throughout, gas heat, and central air.  He is a non-smoker without pets.  There is no known mold/water damage in the home.  Allergies as of 05/06/2018      Reactions   Amoxicillin    Hives   Aspirin    Nsaids    Hives   Olmesartan Nausea Only   Chest pain   Penicillins    Tolmetin Rash   Hives      Medication List       Accurate as of May 06, 2018  5:23 PM. Always use your most recent med list.          albuterol 108 (90 Base) MCG/ACT inhaler Commonly known as:  PROVENTIL HFA;VENTOLIN HFA Inhale into the lungs.   ammonium lactate 12 % lotion Commonly known as:  LAC-HYDRIN APP EXT AA BID   EPINEPHrine 0.3 mg/0.3 mL Soaj injection Commonly known as:  EPI-PEN Inject 0.3 mLs (0.3 mg total) into the muscle once for 1 dose.   fluticasone 50 MCG/ACT nasal spray Commonly known as:  FLONASE Place 1 spray into both nostrils daily as needed for allergies or rhinitis.   ipratropium 0.06 % nasal spray Commonly known as:  ATROVENT Place into the nose.   levocetirizine 5 MG tablet Commonly known as:  XYZAL Take 1 tablet (5 mg total) by mouth daily as needed for allergies.   Olopatadine HCl 0.2 % Soln Commonly known as:  PATADAY Place 1 drop into both eyes daily as needed.   valsartan 160 MG tablet Commonly known as:  DIOVAN       Known medication allergies: Allergies  Allergen Reactions  . Amoxicillin     Hives   . Aspirin   . Nsaids     Hives   . Olmesartan Nausea Only    Chest pain  . Penicillins   . Tolmetin Rash    Hives    I appreciate the opportunity to take part in Gervis's care. Please do not hesitate to contact me with questions.  Sincerely,   R. Jorene Guest, MD

## 2018-09-01 ENCOUNTER — Ambulatory Visit: Payer: Managed Care, Other (non HMO) | Admitting: Allergy and Immunology

## 2018-09-02 ENCOUNTER — Encounter: Payer: Self-pay | Admitting: Family Medicine

## 2018-09-02 ENCOUNTER — Ambulatory Visit (INDEPENDENT_AMBULATORY_CARE_PROVIDER_SITE_OTHER): Payer: BLUE CROSS/BLUE SHIELD | Admitting: Family Medicine

## 2018-09-02 ENCOUNTER — Other Ambulatory Visit: Payer: Self-pay

## 2018-09-02 DIAGNOSIS — J3089 Other allergic rhinitis: Secondary | ICD-10-CM | POA: Diagnosis not present

## 2018-09-02 DIAGNOSIS — T7800XD Anaphylactic reaction due to unspecified food, subsequent encounter: Secondary | ICD-10-CM | POA: Diagnosis not present

## 2018-09-02 DIAGNOSIS — H1013 Acute atopic conjunctivitis, bilateral: Secondary | ICD-10-CM | POA: Diagnosis not present

## 2018-09-02 DIAGNOSIS — L500 Allergic urticaria: Secondary | ICD-10-CM | POA: Diagnosis not present

## 2018-09-02 MED ORDER — LEVOCETIRIZINE DIHYDROCHLORIDE 5 MG PO TABS: 5.0000 mg | ORAL_TABLET | Freq: Every evening | ORAL | 5 refills | Status: DC

## 2018-09-02 NOTE — Patient Instructions (Addendum)
Pruritis Continue H1H2 blockade with levocetirizine 5 mg once a day and famotidine 20 mg once a day  Allergic rhinitis Continue levocetirizine as needed for a runny nose or itch (as above) Continue Flonase 1-2 sprays in each nostril once a day as needed for a runny nose Continue avoidance measures (grass pollen, weed pollen ragweed pollen, tree pollen, cat hair, cockroach, and dust mite)  Allergic conjunctivitis Continue Pataday one drop in each eye once a day as needed for red or itchy eyes  Food allergies Begin to avoid shellfish. In case of an allergic reaction, take Benadryl 50 mg every 4 hours, and if life-threatening symptoms occur, inject with EpiPen 0.3 mg.  Call the clinic if this treatment plan is not working well for you  Follow up in 1 year or sooner if needed

## 2018-09-02 NOTE — Progress Notes (Signed)
RE: Adam Parishomas Mcpeek MRN: [id number] DOB: 11/09/1975 Date of Telemedicine Visit: 09/02/2018  Referring provider: Loyal JacobsonKalish, Michael, MD Primary care provider: Loyal JacobsonKalish, Michael, MD  Chief Complaint: Asthma   Telemedicine Follow Up Visit via Telephone: I connected with Adam Pace for a follow up on 09/02/18 by telephone and verified that I am speaking with the correct person using two identifiers.   I discussed the limitations, risks, security and privacy concerns of performing an evaluation and management service by telephone and the availability of in person appointments. I also discussed with the patient that there may be a patient responsible charge related to this service. The patient expressed understanding and agreed to proceed.  Patient is at home. Provider is at the office.  Visit start time: 11:00 Visit end time: 11:16  History of Present Illness: He is a 43 y.o. male, who is being followed for pruritis, allergic rhinitis, allergic conjunctivitis, and food allergy to shellfish. His previous allergy office visit was on 05/07/2019 with Dr. Nunzio CobbsBobbitt. At today's visit, he reports that his pruritis has been well controlled with no breakthrough itching while taking levocetirizine 5 mg once a day and famotidine 20 mg once a day. Allergic rhinitis is reported as well controlled with at this time. He reports that he has been able to work outside with no adverse symptoms. While he has been advised to avoid shellfish due to a positive skin test at his last visit, he continues to eat shellfish including shrimp, lobster, crab, scallops, and oyster with no adverse effects. He continues to avoid crawfish as this has caused a reaction at a pervious time. He continues to carry an EpiPen at all times. His current medications are listed in the chart.   Assessment and Plan: Maisie Fushomas is a 43 y.o. male with:  Pruritis Continue H1H2 blockade with levocetirizine 5 mg once a day and famotidine 20 mg once a day   Allergic rhinitis Continue levocetirizine as needed for a runny nose or itch (as above) Continue Flonase 1-2 sprays in each nostril once a day as needed for a runny nose Continue avoidance measures (grass pollen, weed pollen ragweed pollen, tree pollen, cat hair, cockroach, and dust mite)  Allergic conjunctivitis Continue Pataday one drop in each eye once a day as needed for red or itchy eyes  Food allergies Begin to avoid shellfish. In case of an allergic reaction, take Benadryl 50 mg every 4 hours, and if life-threatening symptoms occur, inject with EpiPen 0.3 mg.  Call the clinic if this treatment plan is not working well for you  Follow up in 1 year or sooner if needed  Return in about 1 year (around 09/02/2019), or if symptoms worsen or fail to improve.  Meds ordered this encounter  Medications  . levocetirizine (XYZAL) 5 MG tablet    Sig: Take 1 tablet (5 mg total) by mouth every evening.    Dispense:  30 tablet    Refill:  5    Medication List:  Current Outpatient Medications  Medication Sig Dispense Refill  . albuterol (PROVENTIL HFA;VENTOLIN HFA) 108 (90 Base) MCG/ACT inhaler Inhale into the lungs.    Marland Kitchen. ammonium lactate (LAC-HYDRIN) 12 % lotion APP EXT AA BID    . EPINEPHrine 0.3 mg/0.3 mL IJ SOAJ injection INJ 0.3 ML IM ONCE FOR 1 DOSE    . levocetirizine (XYZAL) 5 MG tablet Take 1 tablet (5 mg total) by mouth daily as needed for allergies. 30 tablet 5  . Olopatadine HCl (PATADAY) 0.2 % SOLN  Place 1 drop into both eyes daily as needed. 1 Bottle 5  . omeprazole (PRILOSEC) 20 MG capsule     . valsartan (DIOVAN) 160 MG tablet   4  . fluticasone (FLONASE) 50 MCG/ACT nasal spray Place 1 spray into both nostrils daily as needed for allergies or rhinitis. (Patient not taking: Reported on 09/02/2018) 16 g 5  . levocetirizine (XYZAL) 5 MG tablet Take 1 tablet (5 mg total) by mouth every evening. 30 tablet 5   No current facility-administered medications for this visit.     Allergies: Allergies  Allergen Reactions  . Amoxicillin     Hives   . Aspirin   . Nsaids     Hives   . Olmesartan Nausea Only    Chest pain  . Penicillins   . Tolmetin Rash    Hives   I reviewed his past medical history, social history, family history, and environmental history and no significant changes have been reported from previous visit on 05/07/2019.  Review of Systems  Constitutional: Negative.   HENT: Negative.   Eyes: Negative.   Respiratory: Negative.   Cardiovascular: Negative.   Musculoskeletal: Negative.   Neurological: Negative.   Psychiatric/Behavioral: Negative.    Objective: Physical Exam Not obtained as encounter was done via telephone.   Previous notes and tests were reviewed.  I discussed the assessment and treatment plan with the patient. The patient was provided an opportunity to ask questions and all were answered. The patient agreed with the plan and demonstrated an understanding of the instructions.   The patient was advised to call back or seek an in-person evaluation if the symptoms worsen or if the condition fails to improve as anticipated.  I provided 16 minutes of non-face-to-face time during this encounter.  It was my pleasure to participate in Daevon Norquist care today. Please feel free to contact me with any questions or concerns.   Sincerely,  Thermon Leyland, FNP

## 2019-05-05 ENCOUNTER — Other Ambulatory Visit: Payer: Self-pay | Admitting: *Deleted

## 2019-05-05 MED ORDER — LEVOCETIRIZINE DIHYDROCHLORIDE 5 MG PO TABS: 5.0000 mg | ORAL_TABLET | Freq: Every day | ORAL | 5 refills | Status: DC | PRN

## 2019-07-26 ENCOUNTER — Ambulatory Visit (INDEPENDENT_AMBULATORY_CARE_PROVIDER_SITE_OTHER): Payer: BC Managed Care – PPO | Admitting: Family Medicine

## 2019-07-26 ENCOUNTER — Encounter: Payer: Self-pay | Admitting: Family Medicine

## 2019-07-26 ENCOUNTER — Other Ambulatory Visit: Payer: Self-pay

## 2019-07-26 VITALS — BP 104/70 | HR 76 | Temp 98.4°F | Resp 16 | Ht 68.0 in | Wt 197.5 lb

## 2019-07-26 DIAGNOSIS — J4520 Mild intermittent asthma, uncomplicated: Secondary | ICD-10-CM

## 2019-07-26 DIAGNOSIS — T7800XD Anaphylactic reaction due to unspecified food, subsequent encounter: Secondary | ICD-10-CM

## 2019-07-26 DIAGNOSIS — J3089 Other allergic rhinitis: Secondary | ICD-10-CM

## 2019-07-26 DIAGNOSIS — H1013 Acute atopic conjunctivitis, bilateral: Secondary | ICD-10-CM

## 2019-07-26 DIAGNOSIS — K219 Gastro-esophageal reflux disease without esophagitis: Secondary | ICD-10-CM

## 2019-07-26 HISTORY — DX: Mild intermittent asthma, uncomplicated: J45.20

## 2019-07-26 MED ORDER — FLUTICASONE PROPIONATE 50 MCG/ACT NA SUSP: NASAL | 5 refills | Status: DC

## 2019-07-26 MED ORDER — ALBUTEROL SULFATE HFA 108 (90 BASE) MCG/ACT IN AERS: 2.0000 | INHALATION_SPRAY | RESPIRATORY_TRACT | 1 refills | Status: DC | PRN

## 2019-07-26 MED ORDER — EPINEPHRINE 0.3 MG/0.3ML IJ SOAJ: INTRAMUSCULAR | 1 refills | Status: DC

## 2019-07-26 NOTE — Progress Notes (Signed)
[address] [place] [address] Dept: [phone]  FOLLOW UP NOTE  Patient ID: Adam Pace, male    DOB: [birth year]  Age: 44 y.o. MRN: [id number] Date of Office Visit: 07/26/2019  Assessment  Chief Complaint: Allergic Rhinitis  and Asthma  HPI Adam Pace is a 44 year old male who presents to the clinic for a follow up visit. He was last seen in this clinic on 09/02/2018 for evaluation of pruritus, allergic rhinitis, allergic conjunctivitis, and food allergy to shellfish. At today's visit, he reports that he continues to experience a dry cough occurring at night wen he is lying down and in the morning he reports coughing clear to yellow phlegm after which the cough clears for the day. He recently began taking omeprazole 20 mg twice a day with a slight improvement in the cough at which time he reduced the omeprazole to 20 mg once a day. He reports wheeze occurring while lying on his back at night. The wheeze resolves when lying on his side. He denies shortness of breath. He has used his albuterol 1-2 times over the last year. Allergic rhinitis is reported as moderately well controlled with intermittent sinus pressure, congestion, and thick post nasal drainage with frequent throat clearing. He continues levocetirizine once a day and is not currently using any steroid nasal spray or saline nasal rinses. Allergic conjunctivitis is reported as moderately well controlled with no medical intervention. He continues to eat all shellfish except for crayfish. He reports a reaction to a finned fish about 3 years ago, however, he cannot remember the type of fish. He is currently eating codfish, salmon, and tuna with no adverse reaction. He has an EpiPen available at all times. His current medications are listed in the chart.    Drug Allergies:  Allergies  Allergen Reactions  . Shellfish Allergy Anaphylaxis    Mouth swelling  . Amoxicillin     Hives   . Aspirin   . Nsaids     Hives   .  Olmesartan Nausea Only    Chest pain  . Penicillins   . Tolmetin Rash    Hives    Physical Exam: BP 104/70 (BP Location: Right Arm, Patient Position: Sitting, Cuff Size: Normal)   Pulse 76   Temp 98.4 F (36.9 C) (Oral)   Resp 16   Ht 5\' 8"  (1.727 m)   Wt 197 lb 8.5 oz (89.6 kg)   SpO2 98%   BMI 30.03 kg/m    Physical Exam Vitals reviewed.  Constitutional:      Appearance: Normal appearance.  HENT:     Head: Normocephalic and atraumatic.     Right Ear: Tympanic membrane normal.     Left Ear: Tympanic membrane normal.     Nose:     Comments: Bilateral nares slightly erythematous with no nasal drainage noted. Pharynx slightly erythematous with no exudate. Ears normal. Eyes normal. Eyes:     Conjunctiva/sclera: Conjunctivae normal.  Cardiovascular:     Rate and Rhythm: Normal rate and regular rhythm.     Heart sounds: Normal heart sounds. No murmur.  Pulmonary:     Effort: Pulmonary effort is normal.     Breath sounds: Normal breath sounds.     Comments: Lungs clear to auscultation Musculoskeletal:        General: Normal range of motion.     Cervical back: Normal range of motion and neck supple.  Skin:    General: Skin is warm and dry.  Neurological:  Mental Status: He is alert and oriented to person, place, and time.  Psychiatric:        Mood and Affect: Mood normal.        Behavior: Behavior normal.        Thought Content: Thought content normal.        Judgment: Judgment normal.     Diagnostics: FVC 4.05, FEV1 3.58. Predicted FVC 4.25, predicted FEV1 3.50. Spirometry indicates normal ventilatory function.   Assessment and Plan: 1. Mild intermittent asthma without complication   2. Seasonal and perennial allergic rhinitis   3. Allergic conjunctivitis of both eyes   4. Gastroesophageal reflux disease, unspecified whether esophagitis present   5. Anaphylactic shock due to food, subsequent encounter     Meds ordered this encounter  Medications  .  EPINEPHrine (AUVI-Q) 0.3 mg/0.3 mL IJ SOAJ injection    Sig: Use as directed for severe allergic reactions    Dispense:  2 each    Refill:  1  . albuterol (PROAIR HFA) 108 (90 Base) MCG/ACT inhaler    Sig: Inhale 2 puffs into the lungs every 4 (four) hours as needed for wheezing or shortness of breath.    Dispense:  18 g    Refill:  1    Please keep rx on file. Pt. Will call when needed.  . fluticasone (FLONASE) 50 MCG/ACT nasal spray    Sig: 1-2 sprays per nostril once a day for a runny nose    Dispense:  16 g    Refill:  5    Patient Instructions   Allergic rhinitis Begin Flonase 1-2 sprays in each nostril once a day as needed for a runny nose.  In the right nostril, point the applicator out toward the right ear. In the left nostril, point the applicator out toward the left ear Begin saline nasal rinses as needed for nasal symptoms. Use this before any medicated nasal sprays for best resultContinue levocetirizine as needed for a runny nose or itch  Continue avoidance measures (grass pollen, weed pollen ragweed pollen, tree pollen, cat hair, cockroach, and dust mite)  Allergic conjunctivitis Continue Pataday one drop in each eye once a day as needed for red or itchy eyes  Asthma Continue albuterol 2 puffs every 4 hours as needed for cough or wheeze  Reflux Continue omeprazole as directed by your primary care provider Continue dietary and lifestyle modifications   Food allergies Continue avoiding shellfish and the finned fish you are sensitive to. In case of an allergic reaction, take Benadryl 50 mg every 4 hours, and if life-threatening symptoms occur, inject with EpiPen 0.3 mg.  Call the clinic if this treatment plan is not working well for you  Follow up in 3 months or sooner if needed   Return in about 3 months (around 10/26/2019), or if symptoms worsen or fail to improve.   Thank you for the opportunity to care for this patient.  Please do not hesitate to contact me with  questions.  Thermon Leyland, FNP [facility]  I have provided oversight concerning Thermon Leyland' evaluation and treatment of this patient's health issues addressed during today's encounter. I agree with the assessment and therapeutic plan as outlined in the note.   Thank you for the opportunity to care for this patient.  Please do not hesitate to contact me with questions.  Tonette Bihari, M.D.  [facility] [address]  [place], [place] [address] [phone]

## 2019-07-26 NOTE — Patient Instructions (Addendum)
  Allergic rhinitis Begin Flonase 1-2 sprays in each nostril once a day as needed for a runny nose.  In the right nostril, point the applicator out toward the right ear. In the left nostril, point the applicator out toward the left ear Begin saline nasal rinses as needed for nasal symptoms. Use this before any medicated nasal sprays for best resultContinue levocetirizine as needed for a runny nose or itch  Continue avoidance measures (grass pollen, weed pollen ragweed pollen, tree pollen, cat hair, cockroach, and dust mite)  Allergic conjunctivitis Continue Pataday one drop in each eye once a day as needed for red or itchy eyes  Asthma Continue albuterol 2 puffs every 4 hours as needed for cough or wheeze  Reflux Continue omeprazole as directed by your primary care provider Continue dietary and lifestyle modifications   Food allergies Continue avoiding shellfish and the finned fish you are sensitive to. In case of an allergic reaction, take Benadryl 50 mg every 4 hours, and if life-threatening symptoms occur, inject with EpiPen 0.3 mg.  Call the clinic if this treatment plan is not working well for you  Follow up in 3 months or sooner if needed

## 2019-10-26 ENCOUNTER — Ambulatory Visit (INDEPENDENT_AMBULATORY_CARE_PROVIDER_SITE_OTHER): Payer: BC Managed Care – PPO | Admitting: Family Medicine

## 2019-10-26 ENCOUNTER — Encounter: Payer: Self-pay | Admitting: Family Medicine

## 2019-10-26 ENCOUNTER — Other Ambulatory Visit: Payer: Self-pay

## 2019-10-26 VITALS — BP 104/84 | HR 78 | Temp 98.5°F | Resp 16

## 2019-10-26 DIAGNOSIS — H1013 Acute atopic conjunctivitis, bilateral: Secondary | ICD-10-CM | POA: Diagnosis not present

## 2019-10-26 DIAGNOSIS — J3089 Other allergic rhinitis: Secondary | ICD-10-CM

## 2019-10-26 DIAGNOSIS — K219 Gastro-esophageal reflux disease without esophagitis: Secondary | ICD-10-CM

## 2019-10-26 DIAGNOSIS — L500 Allergic urticaria: Secondary | ICD-10-CM | POA: Diagnosis not present

## 2019-10-26 DIAGNOSIS — J4520 Mild intermittent asthma, uncomplicated: Secondary | ICD-10-CM

## 2019-10-26 DIAGNOSIS — T7800XD Anaphylactic reaction due to unspecified food, subsequent encounter: Secondary | ICD-10-CM

## 2019-10-26 NOTE — Patient Instructions (Addendum)
Asthma Continue albuterol 2 puffs every 4 hours as needed for cough or wheeze  Allergic rhinitis Continue Flonase 1-2 sprays in each nostril once a day as needed for a runny nose.  In the right nostril, point the applicator out toward the right ear. In the left nostril, point the applicator out toward the left ear Continue saline nasal rinses as needed for nasal symptoms. Use this before any medicated nasal sprays for best result Continue levocetirizine 5 mg once a day as needed for a runny nose or itch  Continue avoidance measures (grass pollen, weed pollen ragweed pollen, tree pollen, cat hair, cockroach, and dust mite)  Allergic conjunctivitis Some over the counter eye drops include Pataday one drop in each eye once a day as needed for red, itchy eyes OR Zaditor one drop in each eye twice a day as needed for red itchy eyes.  Reflux Continue omeprazole 20 mg once a day. If you begin to experience heartburn take omeprazole 20 mg twice a day and call the clinic.  Continue dietary and lifestyle modifications as listed below  Food allergies Continue avoiding shellfish and the finned fish you are sensitive to. In case of an allergic reaction, take Benadryl 50 mg every 4 hours, and if life-threatening symptoms occur, inject with EpiPen 0.3 mg.  Pruritus If your symptoms re-occur, begin a journal of events that occurred for up to 6 hours before your symptoms began including foods and beverages consumed, soaps or perfumes you had contact with, and medications.  Continue Xyzal 5 mg once a day as needed for itch (as above)  Call the clinic if this treatment plan is not working well for you  Follow up in 6 months or sooner if needed

## 2019-10-26 NOTE — Progress Notes (Addendum)
[address] [place] [address] Dept: [phone]  FOLLOW UP NOTE  Patient ID: Adam Pace, male    DOB: 10/08/1975  Age: 44 y.o. MRN: [id number] Date of Office Visit: 10/26/2019  Assessment  Chief Complaint: Allergic Rhinitis  and Asthma  HPI Adam Pace is a 44 year old male who presents to the clinic for follow-up visit.  He was last seen in this clinic on 07/26/2019 for evaluation of asthma, allergic rhinitis, allergic conjunctivitis, pleuritis, reflux, and food allergy to shellfish and finned fish.  At today's visit he reports his asthma has been well controlled with no shortness of breath, cough, or wheeze with activity or rest.  He reports rare use of albuterol inhaler.  Allergic rhinitis is reported as well controlled with no congestion.  He reports he takes levocetirizine 5 mg once a day, Flonase as needed and saline nasal spray as needed.  Allergic conjunctivitis is well controlled with Pataday eyedrops as needed.  He reports random itching that occurs on his neck that is resolved with taking levocetirizine.  Reflux is reported as well controlled with 20 mg of omeprazole once a day.  He does report random episodic chest pain occurring several months ago for which he last went to the emergency department.  He reports the chest pain begins in his lower sternum and is reproducible with firm palpation.  He reports this usually takes 1 to 3 days to resolve.  He had tried omeprazole 40 mg once a day and notes this did not notice a difference.  He reports this chest pain has occurred about 3 or 4 times in the last 5 years.  He continues to avoid shellfish and fish that he knows caused him allergic reaction such as crawfish and crab.  He does report that he eats several types of shellfish with no reaction.  His EpiPen's are up-to-date.  His current medications are listed in the chart.   Drug Allergies:  Allergies  Allergen Reactions  . Shellfish Allergy Anaphylaxis    Mouth swelling    . Amoxicillin     Hives   . Aspirin   . Nsaids     Hives   . Olmesartan Nausea Only    Chest pain  . Penicillins   . Tolmetin Rash    Hives    Physical Exam: BP 104/84 (BP Location: Left Arm, Patient Position: Sitting, Cuff Size: Normal)   Pulse 78   Temp 98.5 F (36.9 C) (Oral)   Resp 16   SpO2 97%    Physical Exam Vitals reviewed.  Constitutional:      Appearance: Normal appearance.  HENT:     Head: Normocephalic and atraumatic.     Right Ear: Tympanic membrane normal.     Left Ear: Tympanic membrane normal.     Nose:     Comments: Bilateral nares slightly erythematous with no nasal drainage noted.  Pharynx slightly erythematous with no exudate.  Ears normal.  Eyes normal. Eyes:     Conjunctiva/sclera: Conjunctivae normal.  Cardiovascular:     Rate and Rhythm: Normal rate and regular rhythm.     Heart sounds: Normal heart sounds. No murmur.  Pulmonary:     Effort: Pulmonary effort is normal.     Breath sounds: Normal breath sounds.     Comments: Lungs clear to auscultation Musculoskeletal:        General: Normal range of motion.     Cervical back: Normal range of motion and neck supple.  Skin:  General: Skin is warm and dry.  Neurological:     Mental Status: He is alert and oriented to person, place, and time.  Psychiatric:        Mood and Affect: Mood normal.        Behavior: Behavior normal.        Thought Content: Thought content normal.        Judgment: Judgment normal.     Diagnostics: FVC 3.94, FEV1 3.64.  Predicted FVC 4.24, predicted FEV1 3.49.  Spirometry indicates normal ventilatory function.  Assessment and Plan: 1. Mild intermittent asthma without complication   2. Seasonal and perennial allergic rhinitis   3. Allergic conjunctivitis of both eyes   4. Pruritus/urticaria   5. Anaphylactic shock due to food, subsequent encounter   6. Gastroesophageal reflux disease, unspecified whether esophagitis present     Patient Instructions   Asthma Continue albuterol 2 puffs every 4 hours as needed for cough or wheeze  Allergic rhinitis Continue Flonase 1-2 sprays in each nostril once a day as needed for a runny nose.  In the right nostril, point the applicator out toward the right ear. In the left nostril, point the applicator out toward the left ear Continue saline nasal rinses as needed for nasal symptoms. Use this before any medicated nasal sprays for best result Continue levocetirizine 5 mg once a day as needed for a runny nose or itch  Continue avoidance measures (grass pollen, weed pollen ragweed pollen, tree pollen, cat hair, cockroach, and dust mite)  Allergic conjunctivitis Some over the counter eye drops include Pataday one drop in each eye once a day as needed for red, itchy eyes OR Zaditor one drop in each eye twice a day as needed for red itchy eyes.  Reflux Continue omeprazole 20 mg once a day. If you begin to experience heartburn take omeprazole 20 mg twice a day and call the clinic.  Continue dietary and lifestyle modifications as listed below  Food allergies Continue avoiding shellfish and the finned fish you are sensitive to. In case of an allergic reaction, take Benadryl 50 mg every 4 hours, and if life-threatening symptoms occur, inject with EpiPen 0.3 mg.  Pruritus If your symptoms re-occur, begin a journal of events that occurred for up to 6 hours before your symptoms began including foods and beverages consumed, soaps or perfumes you had contact with, and medications.  Continue Xyzal 5 mg once a day as needed for itch (as above)  Call the clinic if this treatment plan is not working well for you  Follow up in 6 months or sooner if needed   Return in about 6 months (around 04/26/2020), or if symptoms worsen or fail to improve.    Thank you for the opportunity to care for this patient.  Please do not hesitate to contact me with questions.  Gareth Morgan, FNP [facility]   ________________________________________________  I have provided oversight concerning [facility]'s evaluation and treatment of this patient's health issues addressed during today's encounter.  I agree with the assessment and therapeutic plan as outlined in the note.   Signed,   R Edgar Frisk, MD

## 2019-10-27 ENCOUNTER — Ambulatory Visit: Payer: BC Managed Care – PPO | Admitting: Family Medicine

## 2019-12-11 ENCOUNTER — Other Ambulatory Visit: Payer: Self-pay | Admitting: Family Medicine

## 2020-03-15 ENCOUNTER — Other Ambulatory Visit: Payer: Self-pay

## 2020-03-15 ENCOUNTER — Telehealth: Payer: Self-pay | Admitting: Family Medicine

## 2020-03-15 MED ORDER — EPINEPHRINE 0.3 MG/0.3ML IJ SOAJ: INTRAMUSCULAR | 1 refills | Status: DC

## 2020-03-15 NOTE — Telephone Encounter (Signed)
Pt has had auvi-q so sent in refill of auvi-q to [facility]

## 2020-03-15 NOTE — Telephone Encounter (Signed)
Pt request refill for epi-pen.

## 2020-04-26 ENCOUNTER — Encounter: Payer: Self-pay | Admitting: Allergy and Immunology

## 2020-04-26 ENCOUNTER — Ambulatory Visit (INDEPENDENT_AMBULATORY_CARE_PROVIDER_SITE_OTHER): Payer: BC Managed Care – PPO | Admitting: Allergy and Immunology

## 2020-04-26 ENCOUNTER — Other Ambulatory Visit: Payer: Self-pay

## 2020-04-26 DIAGNOSIS — J4520 Mild intermittent asthma, uncomplicated: Secondary | ICD-10-CM

## 2020-04-26 DIAGNOSIS — J3089 Other allergic rhinitis: Secondary | ICD-10-CM

## 2020-04-26 DIAGNOSIS — K219 Gastro-esophageal reflux disease without esophagitis: Secondary | ICD-10-CM | POA: Diagnosis not present

## 2020-04-26 DIAGNOSIS — T7800XD Anaphylactic reaction due to unspecified food, subsequent encounter: Secondary | ICD-10-CM

## 2020-04-26 NOTE — Assessment & Plan Note (Signed)
   Continue appropriate reflux lifestyle modifications and omeprazole 20 mg daily. 

## 2020-04-26 NOTE — Progress Notes (Signed)
Follow-up Note  RE: Adam Pace MRN: [id number] DOB: [birth year] Date of Office Visit: 04/26/2020  Primary care provider: Loyal Jacobson, MD Referring provider: Loyal Jacobson, MD  History of present illness: Adam Pace is a 44 y.o. male with asthma, allergic rhinoconjunctivitis, acid reflux, and food allergies presenting today for follow-up.  He was last seen in this clinic in June 2021.  He reports that in the interval since his previous visit his asthma has been well controlled.  He rarely requires albuterol rescue "once in a blue moon" and denies limitations in normal daily activities and/or nocturnal awakenings due to lower respiratory symptoms.  He states that his nasal allergy symptoms have also been well controlled.  With occasional use of fluticasone nasal spray and/or oral antihistamine.  His acid reflux is well controlled with omeprazole 20 mg daily.  He has no new problems or complaints today.  Assessment and plan: Mild intermittent asthma without complication Well-controlled/stable.  Continue albuterol HFA every 4-6 hours if needed.  The patient's progress will be followed and treatment plan will be adjusted accordingly.  Seasonal and perennial allergic rhinitis  Continue appropriate aeroallergen avoidance measures.  Levocetirizine 5 mg daily if needed  Fluticasone nasal spray, one spray per nostril 1-2 times daily as needed.   Nasal saline spray (i.e. Simply Saline) is recommended prior to medicated nasal sprays and as needed.  Gastroesophageal reflux disease  Continue appropriate reflux lifestyle modifications and omeprazole 20 mg daily.  Food allergy  Avoidance of shellfish and finned fish.   Have access to epinephrine autoinjectors.   No orders of the defined types were placed in this encounter.   Diagnostics: Due to Covid precautions, spirometry was not performed today as the patient's symptoms are reported as well controlled and pulmonary exam was  normal.    Physical examination: Blood pressure 112/70, pulse 92, temperature 97.6 F (36.4 C), temperature source Tympanic, resp. rate 16, SpO2 97 %.  General: Alert, interactive, in no acute distress. HEENT: TMs pearly gray, turbinates minimally edematous without discharge, post-pharynx mildly erythematous. Neck: Supple without lymphadenopathy. Lungs: Clear to auscultation without wheezing, rhonchi or rales. CV: Normal S1, S2 without murmurs. Skin: Warm and dry, without lesions or rashes.  The following portions of the patient's history were reviewed and updated as appropriate: allergies, current medications, past family history, past medical history, past social history, past surgical history and problem list.  Current Outpatient Medications  Medication Sig Dispense Refill  . albuterol (PROAIR HFA) 108 (90 Base) MCG/ACT inhaler Inhale 2 puffs into the lungs every 4 (four) hours as needed for wheezing or shortness of breath. 18 g 1  . EPINEPHrine (AUVI-Q) 0.3 mg/0.3 mL IJ SOAJ injection Use as directed for severe allergic reactions 2 each 1  . fluticasone (FLONASE) 50 MCG/ACT nasal spray 1-2 sprays per nostril once a day for a runny nose 16 g 5  . levocetirizine (XYZAL) 5 MG tablet TAKE 1 TABLET(5 MG) BY MOUTH DAILY AS NEEDED FOR ALLERGIES 30 tablet 5  . omeprazole (PRILOSEC) 20 MG capsule     . valsartan (DIOVAN) 160 MG tablet   4  . Olopatadine HCl (PATADAY) 0.2 % SOLN Place 1 drop into both eyes daily as needed. (Patient not taking: Reported on 04/26/2020) 1 Bottle 5   No current facility-administered medications for this visit.    Allergies  Allergen Reactions  . Shellfish Allergy Anaphylaxis    Mouth swelling  . Amoxicillin     Hives   . Aspirin   .  Nsaids     Hives   . Olmesartan Nausea Only    Chest pain  . Penicillins   . Tolmetin Rash    Hives    I appreciate the opportunity to take part in Adam Pace's care. Please do not hesitate to contact me with  questions.  Sincerely,   R. Jorene Guest, MD

## 2020-04-26 NOTE — Assessment & Plan Note (Signed)
   Continue appropriate aeroallergen avoidance measures.  Levocetirizine 5 mg daily if needed  Fluticasone nasal spray, one spray per nostril 1-2 times daily as needed.   Nasal saline spray (i.e. Simply Saline) is recommended prior to medicated nasal sprays and as needed.

## 2020-04-26 NOTE — Assessment & Plan Note (Signed)
   Avoidance of shellfish and finned fish.   Have access to epinephrine autoinjectors.

## 2020-04-26 NOTE — Patient Instructions (Addendum)
Mild intermittent asthma without complication Well-controlled/stable.  Continue albuterol HFA every 4-6 hours if needed.  The patient's progress will be followed and treatment plan will be adjusted accordingly.  Seasonal and perennial allergic rhinitis  Continue appropriate aeroallergen avoidance measures.  Levocetirizine 5 mg daily if needed  Fluticasone nasal spray, one spray per nostril 1-2 times daily as needed.   Nasal saline spray (i.e. Simply Saline) is recommended prior to medicated nasal sprays and as needed.  Gastroesophageal reflux disease  Continue appropriate reflux lifestyle modifications and omeprazole 20 mg daily.  Food allergy  Avoidance of shellfish and finned fish.   Have access to epinephrine autoinjectors.   Return in about 1 year (around 04/26/2021), or if symptoms worsen or fail to improve.

## 2020-04-26 NOTE — Assessment & Plan Note (Signed)
Well-controlled/stable.  Continue albuterol HFA every 4-6 hours if needed.  The patient's progress will be followed and treatment plan will be adjusted accordingly.

## 2020-06-09 ENCOUNTER — Other Ambulatory Visit: Payer: Self-pay | Admitting: Family Medicine

## 2020-06-15 ENCOUNTER — Other Ambulatory Visit: Payer: Self-pay | Admitting: Family Medicine

## 2020-12-22 ENCOUNTER — Other Ambulatory Visit: Payer: Self-pay | Admitting: Family Medicine

## 2021-03-14 ENCOUNTER — Ambulatory Visit (INDEPENDENT_AMBULATORY_CARE_PROVIDER_SITE_OTHER): Payer: BC Managed Care – PPO | Admitting: Internal Medicine

## 2021-03-14 ENCOUNTER — Other Ambulatory Visit: Payer: Self-pay

## 2021-03-14 VITALS — BP 122/80 | HR 89 | Temp 98.6°F | Resp 17 | Ht 68.5 in | Wt 202.2 lb

## 2021-03-14 DIAGNOSIS — K219 Gastro-esophageal reflux disease without esophagitis: Secondary | ICD-10-CM | POA: Diagnosis not present

## 2021-03-14 DIAGNOSIS — J3089 Other allergic rhinitis: Secondary | ICD-10-CM

## 2021-03-14 DIAGNOSIS — J4520 Mild intermittent asthma, uncomplicated: Secondary | ICD-10-CM

## 2021-03-14 DIAGNOSIS — T7800XD Anaphylactic reaction due to unspecified food, subsequent encounter: Secondary | ICD-10-CM | POA: Diagnosis not present

## 2021-03-14 MED ORDER — EPINEPHRINE 0.3 MG/0.3ML IJ SOAJ: INTRAMUSCULAR | 1 refills | Status: DC

## 2021-03-14 MED ORDER — FLUTICASONE PROPIONATE 50 MCG/ACT NA SUSP: NASAL | 5 refills | Status: DC

## 2021-03-14 MED ORDER — ALBUTEROL SULFATE HFA 108 (90 BASE) MCG/ACT IN AERS: 2.0000 | INHALATION_SPRAY | RESPIRATORY_TRACT | 1 refills | Status: DC | PRN

## 2021-03-14 NOTE — Patient Instructions (Signed)
Mild intermittent asthma without complication Well-controlled/stable. Continue albuterol HFA every 4-6 hours if needed. The patient's progress will be followed and treatment plan will be adjusted accordingly.  Seasonal and perennial allergic rhinitis-uncontrolled Continue appropriate aeroallergen avoidance measures. Levocetirizine 5 mg daily if needed Fluticasone nasal spray, one spray per nostril 1-2 times daily.  Best results if used daily.  Use before bed if symptoms worse at night Nasal saline spray (i.e. Simply Saline) is recommended prior to medicated nasal sprays and as needed. Consider allergy shots for longterm tolerance- will need to update your testing.  Gastroesophageal reflux disease Continue appropriate reflux lifestyle modifications   Food allergy Avoidance of shellfish and finned fish.  Okay to eat the shellfish and fish that you are currently tolerating Epipen to be used for severe allergic reaction  Follow-up for ALLERGY TESTING, try to stop your levocetirizine for 5-7 days before your appointment.

## 2021-03-14 NOTE — Progress Notes (Signed)
FOLLOW UP Date of Service/Encounter:  03/14/21   Subjective:  Adam Pace (DOB: 1975/08/13) is a 45 y.o. male who [facility] in re-evaluation of the following: allergic rhinitis, intermittent asthma, GERD, food allergy History obtained from: chart review and patient.  For Review, LV was on 04/26/20  with Dr. Nunzio Cobbs seen for mild intermittent asthma (albuterol PRN), seasonal and perennial allergic rhinitis (levocetirizine, floanse 1-2 SEN daily, nasal saline), GERD (omeprazole), food allergy (shellfish, fined fish).    Previous Diagnostics:  05/06/18- Environmental skin testing: Positive to grass pollen, weed pollen, ragweed pollen, tree pollen, cat hair, cockroach antigen, and dust mite antigen. Food allergen skin testing: Positive to shrimp, crab, lobster, and scallop.  Asthma is pretty much controlled.  He will have daytime symptoms about 1-2 times per month.  He will use his albuterol which resolves coughing. No nighttime coughing or symptoms.   His reflux is controlled with lifestyle modifications (cut out orange juice at night). Today he reports that he is eating shellfish-crabs, clams, shrimp, lobster and he is not bothered by them.  He does eat some finned fish.  He avoids crawfish. He uses levocetirizine daily and flonase he is using as needed.  Allergies as of 03/14/2021       Reactions   Shellfish Allergy Anaphylaxis   Mouth swelling   Amoxicillin    Hives   Aspirin    Nsaids    Hives   Olmesartan Nausea Only   Chest pain   Penicillins    Tolmetin Rash   Hives        Medication List        Accurate as of March 14, 2021  4:02 PM. If you have any questions, ask your nurse or doctor.          albuterol 108 (90 Base) MCG/ACT inhaler Commonly known as: VENTOLIN HFA Inhale 2 puffs into the lungs every 4 (four) hours as needed for wheezing or shortness of breath.   EPINEPHrine 0.3 mg/0.3 mL Soaj  injection Commonly known as: Auvi-Q Use as directed for severe allergic reactions   fluticasone 50 MCG/ACT nasal spray Commonly known as: FLONASE 1-2 sprays per nostril once a day for a runny nose   levocetirizine 5 MG tablet Commonly known as: XYZAL TAKE 1 TABLET(5 MG) BY MOUTH DAILY AS NEEDED FOR ALLERGIES   Olopatadine HCl 0.2 % Soln Commonly known as: Pataday Place 1 drop into both eyes daily as needed.   omeprazole 20 MG capsule Commonly known as: PRILOSEC   valsartan 160 MG tablet Commonly known as: DIOVAN       Past Medical History:  Diagnosis Date   High cholesterol    Hypertension    Kidney stones    Mild intermittent asthma without complication 07/26/2019   Urticaria    No past surgical history on file. Otherwise, there have been no changes to his past medical history, surgical history, family history, or social history.  ROS: All others negative except as noted per HPI.   Objective:  BP 122/80   Pulse 89   Temp 98.6 F (37 C) (Temporal)   Resp 17   Ht 5' 8.5" (1.74 m)   Wt 202 lb 3.2 oz (91.7 kg)   SpO2 99%   BMI 30.30 kg/m  Body mass index is 30.3 kg/m. Physical Exam: General Appearance:  Alert, cooperative, no distress, appears stated age  Head:  Normocephalic, without obvious abnormality, atraumatic  Eyes:  Conjunctiva clear,  EOM's intact  Nose: Nares normal  Throat: Lips, tongue normal; teeth and gums normal  Neck: Supple, symmetrical  Lungs:   Respirations unlabored, no coughing  Heart:  Appears well perfused  Extremities: No edema  Skin: Skin color, texture, turgor normal, no rashes or lesions on visualized portions of skin  Neurologic: No gross deficits   Spirometry:  Tracings reviewed. His effort: Good reproducible efforts. FVC: 3.50L FEV1: 3.07L, 86% predicted FEV1/FVC ratio: 107% Interpretation: No overt abnormalities noted given today's efforts.  Please see scanned spirometry results for details.  Assessment/Plan   Patient  Instructions  Mild intermittent asthma without complication Well-controlled/stable. Continue albuterol HFA every 4-6 hours if needed. The patient's progress will be followed and treatment plan will be adjusted accordingly.  Seasonal and perennial allergic rhinitis-uncontrolled Continue appropriate aeroallergen avoidance measures. Levocetirizine 5 mg daily if needed Fluticasone nasal spray, one spray per nostril 1-2 times daily.  Best results if used daily.  Use before bed if symptoms worse at night Nasal saline spray (i.e. Simply Saline) is recommended prior to medicated nasal sprays and as needed. Consider allergy shots for longterm tolerance- will need to update your testing.  Gastroesophageal reflux disease-chronic stable Continue appropriate reflux lifestyle modifications   Food allergy-chronic, stable Avoidance of shellfish and finned fish.  Okay to eat the shellfish and fish that you are currently tolerating Epipen to be used for severe allergic reaction  Follow-up for ALLERGY TESTING, try to stop your levocetirizine for 5-7 days before your appointment.  Tonny Bollman, MD  [facility]

## 2021-04-15 ENCOUNTER — Encounter: Payer: Self-pay | Admitting: Family Medicine

## 2021-04-15 ENCOUNTER — Other Ambulatory Visit: Payer: Self-pay

## 2021-04-15 ENCOUNTER — Ambulatory Visit (INDEPENDENT_AMBULATORY_CARE_PROVIDER_SITE_OTHER): Payer: BC Managed Care – PPO | Admitting: Family Medicine

## 2021-04-15 VITALS — BP 120/80 | HR 93 | Temp 98.4°F | Resp 16

## 2021-04-15 DIAGNOSIS — J3089 Other allergic rhinitis: Secondary | ICD-10-CM

## 2021-04-15 DIAGNOSIS — J302 Other seasonal allergic rhinitis: Secondary | ICD-10-CM | POA: Diagnosis not present

## 2021-04-15 DIAGNOSIS — L500 Allergic urticaria: Secondary | ICD-10-CM

## 2021-04-15 DIAGNOSIS — H1013 Acute atopic conjunctivitis, bilateral: Secondary | ICD-10-CM

## 2021-04-15 DIAGNOSIS — T7800XD Anaphylactic reaction due to unspecified food, subsequent encounter: Secondary | ICD-10-CM

## 2021-04-15 DIAGNOSIS — J4520 Mild intermittent asthma, uncomplicated: Secondary | ICD-10-CM

## 2021-04-15 DIAGNOSIS — K219 Gastro-esophageal reflux disease without esophagitis: Secondary | ICD-10-CM

## 2021-04-15 NOTE — Patient Instructions (Addendum)
Asthma Continue albuterol 2 puffs once every 4 hours as needed for cough or wheeze You may use albuterol 2 puffs 5 to 15 minutes before activity to decrease cough or wheeze  Allergic rhinitis Your skin testing was positive to grass pollens, weed pollens, tree pollens, dog, molds, cockroach, and dust mites and borderline positive to cat. Avoidance measures are listed below Continue levocetirizine 5 mg once a day as needed for runny nose or itch.Remember to rotate to a different antihistamine about every 3 months. Some examples of over the counter antihistamines include Zyrtec (cetirizine), Xyzal (levocetirizine), Allegra (fexofenadine), and Claritin (loratidine).  Continue Flonase 1 to 2 sprays in each nostril once a day as needed for stuffy nose. In the right nostril, point the applicator out toward the right ear. In the left nostril, point the applicator out toward the left ear Consider saline nasal rinses as needed for nasal symptoms. Use this before any medicated nasal sprays for best result Consider allergen immunotherapy if the treatment plan listed above does not control your allergy symptoms  Reflux Continue dietary lifestyle modifications as listed below Begin famotidine 20 mg twice a day to control reflux.  This may help with cough and wheeze as well.  Papular urticaria Continue levocetirizine 5 mg once a day.  You may take an additional dose of levocetirizine 5 mg once a day as needed for breakthrough symptoms.  Food allergy Your skin testing was slightly positive to the fish mix and negative to individual fish and shellfish.  Continue to avoid fish and shellfish for now. We will call you when the results become available. In case of an allergic reaction, take Benadryl 50 mg every 4 hours, and if life-threatening symptoms occur, inject with AuviQ 0.3 mg. We have ordered a blood test to help [place] evaluate your food allergy. We will call you when the result becomes available Call the  clinic if this treatment plan is not working well for you  Follow up in 3 months or sooner if needed.  Reducing Pollen Exposure The American Academy of Allergy, Asthma and Immunology suggests the following steps to reduce your exposure to pollen during allergy seasons. Do not hang sheets or clothing out to dry; pollen may collect on these items. Do not mow lawns or spend time around freshly cut grass; mowing stirs up pollen. Keep windows closed at night.  Keep car windows closed while driving. Minimize morning activities outdoors, a time when pollen counts are usually at their highest. Stay indoors as much as possible when pollen counts or humidity is high and on windy days when pollen tends to remain in the air longer. Use air conditioning when possible.  Many air conditioners have filters that trap the pollen spores. Use a HEPA room air filter to remove pollen form the indoor air you breathe.  Control of Mold Allergen Mold and fungi can grow on a variety of surfaces provided certain temperature and moisture conditions exist.  Outdoor molds grow on plants, decaying vegetation and soil.  The major outdoor mold, Alternaria and Cladosporium, are found in very high numbers during hot and dry conditions.  Generally, a late Summer - Fall peak is seen for common outdoor fungal spores.  Rain will temporarily lower outdoor mold spore count, but counts rise rapidly when the rainy period ends.  The most important indoor molds are Aspergillus and Penicillium.  Dark, humid and poorly ventilated basements are ideal sites for mold growth.  The next most common sites of mold growth are  the bathroom and the kitchen.  Outdoor [employer] Use air conditioning and keep windows closed Avoid exposure to decaying vegetation. Avoid leaf raking. Avoid grain handling. Consider wearing a face mask if working in moldy areas.  Indoor Mold Control Maintain humidity below 50%. Clean washable surfaces with 5% bleach  solution. Remove sources e.g. Contaminated carpets.   Control of Dust Mite Allergen Dust mites play a major role in allergic asthma and rhinitis. They occur in environments with high humidity wherever human skin is found. Dust mites absorb humidity from the atmosphere (ie, they do not drink) and feed on organic matter (including shed human and animal skin). Dust mites are a microscopic type of insect that you cannot see with the naked eye. High levels of dust mites have been detected from mattresses, pillows, carpets, upholstered furniture, bed covers, clothes, soft toys and any woven material. The principal allergen of the dust mite is found in its feces. A gram of dust may contain 1,000 mites and 250,000 fecal particles. Mite antigen is easily measured in the air during house cleaning activities. Dust mites do not bite and do not cause harm to humans, other than by triggering allergies/asthma.  Ways to decrease your exposure to dust mites in your home:  1. Encase mattresses, box springs and pillows with a mite-impermeable barrier or cover  2. Wash sheets, blankets and drapes weekly in hot water (130 F) with detergent and dry them in a dryer on the hot setting.  3. Have the room cleaned frequently with a vacuum cleaner and a damp dust-mop. For carpeting or rugs, vacuuming with a vacuum cleaner equipped with a high-efficiency particulate air (HEPA) filter. The dust mite allergic individual should not be in a room which is being cleaned and should wait 1 hour after cleaning before going into the room.  4. Do not sleep on upholstered furniture (eg, couches).  5. If possible removing carpeting, upholstered furniture and drapery from the home is ideal. Horizontal blinds should be eliminated in the rooms where the person spends the most time (bedroom, study, television room). Washable vinyl, roller-type shades are optimal.  6. Remove all non-washable stuffed toys from the bedroom. Wash stuffed toys  weekly like sheets and blankets above.  7. Reduce indoor humidity to less than 50%. Inexpensive humidity monitors can be purchased at most hardware stores. Do not use a humidifier as can make the problem worse and are not recommended.  Control of Dog or Cat Allergen Avoidance is the best way to manage a dog or cat allergy. If you have a dog or cat and are allergic to dog or cats, consider removing the dog or cat from the home. If you have a dog or cat but don't want to find it a new home, or if your family wants a pet even though someone in the household is allergic, here are some strategies that may help keep symptoms at bay:  Keep the pet out of your bedroom and restrict it to only a few rooms. Be advised that keeping the dog or cat in only one room will not limit the allergens to that room. Don't pet, hug or kiss the dog or cat; if you do, wash your hands with soap and water. High-efficiency particulate air (HEPA) cleaners run continuously in a bedroom or living room can reduce allergen levels over time. Regular use of a high-efficiency vacuum cleaner or a central vacuum can reduce allergen levels. Giving your dog or cat a bath at least once  a week can reduce airborne allergen.  Control of Cockroach Allergen Cockroach allergen has been identified as an important cause of acute attacks of asthma, especially in urban settings.  There are fifty-five species of cockroach that exist in the [place], however only three, the [place], [place] species produce allergen that can affect patients with Asthma.  Allergens can be obtained from fecal particles, egg casings and secretions from cockroaches.    Remove food sources. Reduce access to water. Seal access and entry points. Spray runways with 0.5-1% Diazinon or Chlorpyrifos Blow boric acid power under stoves and refrigerator. Place bait stations (hydramethylnon) at feeding sites.

## 2021-04-15 NOTE — Progress Notes (Signed)
[address] [place] [address] Dept: [phone]  FOLLOW UP NOTE  Patient ID: Adam Pace, male    DOB: 1975-11-15  Age: 45 y.o. MRN: [id number] Date of Office Visit: 04/15/2021  Assessment  Chief Complaint: Allergy Testing  HPI Adam Pace is a 45 year old male who presents the clinic for follow-up visit.  He was last seen in this clinic on 03/14/2021 by Dr. Maurine Minister for evaluation of asthma, allergic rhinitis, reflux, and food allergy to fish and shellfish.  At today's visit, he reports that he is feeling well overall.  Asthma is reported as moderately well controlled with symptoms including wheezing occurring at night and dry cough occurring mostly at nighttime and occasionally during the day.  He denies shortness of breath with activity or rest.  He reports he rarely uses his albuterol, however, when he does it is usually during the nighttime.  Allergic rhinitis is reported as moderately well controlled with clear thin rhinorrhea occurring when it is cold outside.  He continues levocetirizine 5 mg once a day and is not currently using Flonase or nasal saline rinses.  Reflux is reported as moderately well controlled with omeprazole as needed.  He is avoiding orange juice as this is a big driver of reflux for him.  He continues to avoid fish and some shellfish.  He has not needed to use his epinephrine autoinjectors since his last visit to this clinic.  He does report that he occasionally begins to experience raised red itchy areas on his neck and back that will last for about 10 to 15 minutes.  He denies concomitant cardiopulmonary or gastrointestinal symptoms with these hives.  He reports that once he takes levocetirizine the hives disappeared in just a few minutes.  He reports this occurs intermittently every several days.  He denies new foods, personal care products, medications, insect bites, or living circumstances.  His current medications are listed in the chart.   Drug Allergies:   Allergies  Allergen Reactions   Shellfish Allergy Anaphylaxis    Mouth swelling   Amoxicillin     Hives    Aspirin    Nsaids     Hives    Olmesartan Nausea Only    Chest pain   Penicillins    Tolmetin Rash    Hives    Physical Exam: BP 120/80   Pulse 93   Temp 98.4 F (36.9 C) (Temporal)   Resp 16   SpO2 97%    Physical Exam Vitals reviewed.  Constitutional:      Appearance: Normal appearance.  HENT:     Head: Normocephalic and atraumatic.     Right Ear: Tympanic membrane normal.     Left Ear: Tympanic membrane normal.     Nose:     Comments: Bilateral naris edematous and pale with clear nasal drainage noted.  Pharynx normal.  Ears normal.  Eyes normal.    Mouth/Throat:     Pharynx: Oropharynx is clear.  Eyes:     Conjunctiva/sclera: Conjunctivae normal.  Cardiovascular:     Rate and Rhythm: Normal rate and regular rhythm.     Heart sounds: Normal heart sounds. No murmur heard. Pulmonary:     Effort: Pulmonary effort is normal.     Breath sounds: Normal breath sounds.     Comments: Lungs clear to auscultation Musculoskeletal:        General: Normal range of motion.     Cervical back: Normal range of motion and neck supple.  Skin:  General: Skin is warm and dry.     Comments: No urticaria noted at today's visit  Neurological:     Mental Status: He is alert and oriented to person, place, and time.  Psychiatric:        Mood and Affect: Mood normal.        Behavior: Behavior normal.        Thought Content: Thought content normal.        Judgment: Judgment normal.    Diagnostics: Percutaneous environmental skin testing was positive to grass, weeds, ragweed, trees, dust mite, cat, and dog with adequate controls  Intradermal skin testing was positive to grass mix, ragweed mix, weed mix, major mold mix 3, major mold mix 4, and cockroach with adequate control.  Assessment and Plan: 1. Mild intermittent asthma without complication   2. Seasonal and  perennial allergic rhinitis   3. Allergic conjunctivitis of both eyes   4. Pruritus/urticaria   5. Gastroesophageal reflux disease, unspecified whether esophagitis present   6. Anaphylactic shock due to food, subsequent encounter   7. Seasonal and perennial allergic rhinitis      Patient Instructions  Asthma Continue albuterol 2 puffs once every 4 hours as needed for cough or wheeze You may use albuterol 2 puffs 5 to 15 minutes before activity to decrease cough or wheeze  Allergic rhinitis Your skin testing was positive to grass pollens, weed pollens, tree pollens, dog, molds, cockroach, and dust mites and borderline positive to cat. Avoidance measures are listed below Continue levocetirizine 5 mg once a day as needed for runny nose or itch.Remember to rotate to a different antihistamine about every 3 months. Some examples of over the counter antihistamines include Zyrtec (cetirizine), Xyzal (levocetirizine), Allegra (fexofenadine), and Claritin (loratidine).  Continue Flonase 1 to 2 sprays in each nostril once a day as needed for stuffy nose. In the right nostril, point the applicator out toward the right ear. In the left nostril, point the applicator out toward the left ear Consider saline nasal rinses as needed for nasal symptoms. Use this before any medicated nasal sprays for best result Consider allergen immunotherapy if the treatment plan listed above does not control your allergy symptoms  Reflux Continue dietary lifestyle modifications as listed below Begin famotidine 20 mg twice a day to control reflux.  This may help with cough and wheeze as well.  Papular urticaria Continue levocetirizine 5 mg once a day.  You may take an additional dose of levocetirizine 5 mg once a day as needed for breakthrough symptoms.  Food allergy Your skin testing was slightly positive to the fish mix and negative to individual fish and shellfish.  Continue to avoid fish and shellfish for now. We will  call you when the results become available. In case of an allergic reaction, take Benadryl 50 mg every 4 hours, and if life-threatening symptoms occur, inject with AuviQ 0.3 mg. We have ordered a blood test to help [place] evaluate your food allergy. We will call you when the result becomes available Call the clinic if this treatment plan is not working well for you  Follow up in 3 months or sooner if needed.   Return in about 3 months (around 07/16/2021), or if symptoms worsen or fail to improve.    Thank you for the opportunity to care for this patient.  Please do not hesitate to contact me with questions.  Thermon Leyland, FNP [facility]

## 2021-04-16 ENCOUNTER — Other Ambulatory Visit: Payer: Self-pay | Admitting: Internal Medicine

## 2021-04-16 DIAGNOSIS — J3089 Other allergic rhinitis: Secondary | ICD-10-CM

## 2021-04-16 NOTE — Progress Notes (Signed)
Encounter created to place allergy shot prescription.

## 2021-04-22 NOTE — Progress Notes (Signed)
Aeroallergen Immunotherapy   Ordering Provider: Dr. Tonny Bollman   Patient Details  Name: Adam Pace  MRN: [id number]  Date of Birth: [birth year]   Order 1 of 2   Vial Label: G-W-RW-T-Dog   0.3 ml (Volume)  BAU Concentration -- 7 Grass Mix* 100,000 ([address] [place], [place], [place], [place], [place], Sweet [place], [place])  0.2 ml (Volume)  1:20 Concentration -- [place]  0.3 ml (Volume)  BAU Concentration -- [place] 10,000  0.2 ml (Volume)  1:20 Concentration -- [place]  0.3 ml (Volume)  1:20 Concentration -- Ragweed Mix  0.2 ml (Volume)  1:20 Concentration -- Burweed [place]  0.5 ml (Volume)  1:20 Concentration -- Eastern 10 Tree Mix (also Sweet Gum)  0.2 ml (Volume)  1:20 Concentration -- Walnut, Black Pollen  0.5 ml (Volume)  1:10 Concentration -- Dog Epithelia    2.7  ml Extract Subtotal  2.3  ml Diluent  5.0  ml Maintenance Total   Schedule:  B  Blue Vial (1:100,000): Schedule B (6 doses)  Yellow Vial (1:10,000): Schedule B (6 doses)  Green Vial (1:1,000): Schedule B (6 doses)  Red Vial (1:100): Schedule A (10 doses)   Special Instructions: normal B protocol

## 2021-04-22 NOTE — Progress Notes (Signed)
Aeroallergen Immunotherapy   Ordering Provider: Dr. Tonny Bollman   Patient Details  Name: Adam Pace  MRN: [id number]  Date of Birth: May 11, 1976   Order 2 of 2   Vial Label: Cat-DM-M-CR   0.2 ml (Volume)  1:20 Concentration -- Bipolaris sorokiniana  0.2 ml (Volume)  1:20 Concentration -- Drechslera spicifera  0.2 ml (Volume)  1:10 Concentration -- Mucor plumbeus  0.2 ml (Volume)  1:10 Concentration -- Fusarium moniliforme  0.2 ml (Volume)  1:40 Concentration -- Aureobasidium pullulans  0.2 ml (Volume)  1:10 Concentration -- Rhizopus oryzae  0.5 ml (Volume)  1:10 Concentration -- Cat Hair  0.3 ml (Volume)  1:20 Concentration -- Cockroach, [place]  0.5 ml (Volume)   AU Concentration -- Mite Mix (DF 5,000 & DP 5,000)    2.5  ml Extract Subtotal  2.5  ml Diluent  5.0  ml Maintenance Total   Schedule:  B   Blue Vial (1:100,000): Schedule B (6 doses)  Yellow Vial (1:10,000): Schedule B (6 doses)  Green Vial (1:1,000): Schedule B (6 doses)  Red Vial (1:100): Schedule A (10 doses)   Special Instructions: normal B protocol

## 2021-04-22 NOTE — Progress Notes (Signed)
VIALS MADE. EXP 04-22-22 

## 2021-04-26 DIAGNOSIS — J3081 Allergic rhinitis due to animal (cat) (dog) hair and dander: Secondary | ICD-10-CM | POA: Diagnosis not present

## 2021-04-29 DIAGNOSIS — J3089 Other allergic rhinitis: Secondary | ICD-10-CM | POA: Diagnosis not present

## 2021-05-13 ENCOUNTER — Other Ambulatory Visit: Payer: Self-pay

## 2021-05-13 ENCOUNTER — Ambulatory Visit (INDEPENDENT_AMBULATORY_CARE_PROVIDER_SITE_OTHER): Payer: BC Managed Care – PPO

## 2021-05-13 DIAGNOSIS — J309 Allergic rhinitis, unspecified: Secondary | ICD-10-CM

## 2021-05-13 NOTE — Progress Notes (Signed)
Immunotherapy   Patient Details  Name: Adam Pace MRN: [id number] Date of Birth: 11/21/1975  05/13/2021  [name] started injections for Blue 1:100,000 (C-DM-M-CR  and G-W-RW-T-DOG) Following schedule: B  Frequency:1 time per week Epi-Pen:Epi-Pen Available  Consent signed and patient instructions given.   [name] 05/13/2021, 9:54 AM

## 2021-05-21 ENCOUNTER — Ambulatory Visit (INDEPENDENT_AMBULATORY_CARE_PROVIDER_SITE_OTHER): Payer: BC Managed Care – PPO

## 2021-05-21 DIAGNOSIS — J309 Allergic rhinitis, unspecified: Secondary | ICD-10-CM | POA: Diagnosis not present

## 2021-05-28 ENCOUNTER — Ambulatory Visit (INDEPENDENT_AMBULATORY_CARE_PROVIDER_SITE_OTHER): Payer: BC Managed Care – PPO

## 2021-05-28 DIAGNOSIS — J309 Allergic rhinitis, unspecified: Secondary | ICD-10-CM

## 2021-06-04 ENCOUNTER — Ambulatory Visit (INDEPENDENT_AMBULATORY_CARE_PROVIDER_SITE_OTHER): Payer: BC Managed Care – PPO

## 2021-06-04 DIAGNOSIS — J309 Allergic rhinitis, unspecified: Secondary | ICD-10-CM | POA: Diagnosis not present

## 2021-06-11 ENCOUNTER — Ambulatory Visit (INDEPENDENT_AMBULATORY_CARE_PROVIDER_SITE_OTHER): Payer: BC Managed Care – PPO

## 2021-06-11 DIAGNOSIS — J309 Allergic rhinitis, unspecified: Secondary | ICD-10-CM | POA: Diagnosis not present

## 2021-06-18 ENCOUNTER — Ambulatory Visit (INDEPENDENT_AMBULATORY_CARE_PROVIDER_SITE_OTHER): Payer: BC Managed Care – PPO

## 2021-06-18 DIAGNOSIS — J309 Allergic rhinitis, unspecified: Secondary | ICD-10-CM | POA: Diagnosis not present

## 2021-06-19 ENCOUNTER — Other Ambulatory Visit: Payer: Self-pay

## 2021-06-19 MED ORDER — LEVOCETIRIZINE DIHYDROCHLORIDE 5 MG PO TABS: ORAL_TABLET | ORAL | 0 refills | Status: DC

## 2021-06-25 ENCOUNTER — Ambulatory Visit (INDEPENDENT_AMBULATORY_CARE_PROVIDER_SITE_OTHER): Payer: BC Managed Care – PPO

## 2021-06-25 DIAGNOSIS — J309 Allergic rhinitis, unspecified: Secondary | ICD-10-CM

## 2021-07-01 ENCOUNTER — Telehealth: Payer: Self-pay | Admitting: Family Medicine

## 2021-07-01 ENCOUNTER — Encounter: Payer: Self-pay | Admitting: Family Medicine

## 2021-07-01 NOTE — Telephone Encounter (Signed)
Thank you for the photos. Can you please find out if he is taking an antihistamine daily? Also any other symptoms other than skin rash? Thank you

## 2021-07-01 NOTE — Telephone Encounter (Signed)
Please have him hold his injection for this week. Have him continue xyzal once a day and monitor his skin closely over the next several days. Please have him continue to take pics daily. Thank you

## 2021-07-01 NOTE — Telephone Encounter (Signed)
Can you please ask if he is taking the levocetirizine daily. And please make sure he is using a daily moisturizing routine. Please have him begin to take pictures if it occurs again. Any other symptoms along with the red dry skin? Thank you

## 2021-07-01 NOTE — Telephone Encounter (Signed)
Pt states he has an increase in itching all over body, dry patches and would like a call back.

## 2021-07-01 NOTE — Telephone Encounter (Signed)
Pt states he's been experiencing frequent itching,dry red spots all over his body and they are warm to touch, and he noticed the onset since starting shots, pt have OV appt 07/17/21. He gets his shots on tuesdays he mentioned. Please advise.

## 2021-07-03 NOTE — Telephone Encounter (Signed)
Ok. Please send out the H1H2 ladder as listed below. Any changes in his life such as new medications, foods, personal care products, insect stings or tick bites? Any symptoms other than skin rash like throat closing, abdominal pain or diarrhea? Thank you  Levocetirizine (Xyzal) 5 mg twice a day and famotidine (Pepcid) 20 mg twice a day. If no symptoms for 7-14 days then decrease to... Levocetirizine (Xyzal) 5 mg twice a day and famotidine (Pepcid) 20 mg once a day.  If no symptoms for 7-14 days then decrease to... Levocetirizine (Xyzal) 5 mg twice a day.  If no symptoms for 7-14 days then decrease to... Levocetirizine (Xyzal) 5 mg once a day.

## 2021-07-09 NOTE — Telephone Encounter (Signed)
Patient interested in stopping allergy immunotherapy at this point. He will keep his appointment on this upcoming Tuesday and likely sign the paperwork to discontinue injections at this time. He will call the clinic with any questions.

## 2021-07-17 ENCOUNTER — Encounter: Payer: Self-pay | Admitting: Family Medicine

## 2021-07-17 ENCOUNTER — Ambulatory Visit (INDEPENDENT_AMBULATORY_CARE_PROVIDER_SITE_OTHER): Payer: BC Managed Care – PPO | Admitting: Family Medicine

## 2021-07-17 ENCOUNTER — Other Ambulatory Visit: Payer: Self-pay

## 2021-07-17 VITALS — BP 116/80 | HR 91 | Temp 97.9°F | Resp 17 | Ht 69.0 in

## 2021-07-17 DIAGNOSIS — L282 Other prurigo: Secondary | ICD-10-CM

## 2021-07-17 DIAGNOSIS — K219 Gastro-esophageal reflux disease without esophagitis: Secondary | ICD-10-CM

## 2021-07-17 DIAGNOSIS — H1013 Acute atopic conjunctivitis, bilateral: Secondary | ICD-10-CM

## 2021-07-17 DIAGNOSIS — J4520 Mild intermittent asthma, uncomplicated: Secondary | ICD-10-CM

## 2021-07-17 DIAGNOSIS — J3089 Other allergic rhinitis: Secondary | ICD-10-CM

## 2021-07-17 NOTE — Progress Notes (Deleted)
[place] [address] Dept: [phone]  FOLLOW UP NOTE  Patient ID: Adam Pace, male    DOB: 1975-09-14  Age: 46 y.o. MRN: [id number] Date of Office Visit: 07/17/2021  Assessment  Chief Complaint: Follow-up (Pt he have been doing well, his skin have improved, he's not itching as much, but he started dry coughing and his nose have been dry as well.), Asthma, and Allergic Rhinitis   HPI [name] is a 46 year old male who presents the clinic for follow-up visit.  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  At that time, he started on allergen immunotherapy.  He reports that after each injection he began to experience hives and itching.   Drug Allergies:  Allergies  Allergen Reactions   Shellfish Allergy Anaphylaxis    Mouth swelling   Amoxicillin     Hives    Aspirin    Nsaids     Hives    Olmesartan Nausea Only    Chest pain   Penicillins    Tolmetin Rash    Hives    Physical Exam: BP 116/80    Pulse 91    Temp 97.9 F (36.6 C) (Temporal)    Resp 17    Ht 5\' 9"  (1.753 m)    SpO2 98%    BMI 29.86 kg/m    Physical Exam  Diagnostics:    Assessment and Plan: 1. Mild intermittent asthma without complication   2. Seasonal and perennial allergic rhinitis   3. Allergic conjunctivitis of both eyes   4. Papular urticaria   5. Gastroesophageal reflux disease, unspecified whether esophagitis present     No orders of the defined types were placed in this encounter.   Patient Instructions  Asthma Continue albuterol 2 puffs once every 4 hours as needed for cough or wheeze You may use albuterol 2 puffs 5 to 15 minutes before activity to decrease cough or wheeze  Allergic rhinitis Continue allergen avoidance measures directed toward grass pollens, weed pollens, tree pollens, dog, molds,  cockroach, dust mites, and cat  Continue levocetirizine 5 mg once a day as needed for runny nose or itch.Remember to rotate to a different antihistamine about every 3 months. Some examples of over the counter antihistamines include Zyrtec (cetirizine), Xyzal (levocetirizine), Allegra (fexofenadine), and Claritin (loratidine).  Continue Flonase 1 to 2 sprays in each nostril once a day as needed for stuffy nose. In the right nostril, point the applicator out toward the right ear. In the left nostril, point the applicator out toward the left ear Consider saline nasal rinses as needed for nasal symptoms. Use this before any medicated nasal sprays for best result  Reflux Continue dietary and lifestyle modifications as listed below If you begin to experience heartburn, begin famotidine 20 mg twice a day to control reflux.   Hives Use the least amount of medications while remaining hive free Levocetirizine (Xyzal) 5 mg twice a day and famotidine (Pepcid) 20 mg twice a day. If no symptoms for 7-14 days then decrease to Levocetirizine (Xyzal) 5 mg twice a day and famotidine (Pepcid) 20 mg once a day.  If no symptoms for 7-14 days then decrease to Levocetirizine (Xyzal) 5 mg twice a day.  If no symptoms for 7-14 days then decrease to Levocetirizine (Xyzal) 5 mg once a day.  We will order some lab  tests to help Adam Pace evaluate and treat your hives  Food allergy Continue to avoid fish and shellfish for now. We will call you when the results become available. In case of an allergic reaction, take Benadryl 50 mg every 4 hours, and if life-threatening symptoms occur, inject with AuviQ 0.3 mg. We have ordered a blood test to help Adam Pace evaluate your food allergy. We will call you when the result becomes available  Call the clinic if this treatment plan is not working well for you  Follow up in 6 months or sooner if needed.  Reducing Pollen Exposure The American Academy of Allergy, Asthma and Immunology  suggests the following steps to reduce your exposure to pollen during allergy seasons. Do not hang sheets or clothing out to dry; pollen may collect on these items. Do not mow lawns or spend time around freshly cut grass; mowing stirs up pollen. Keep windows closed at night.  Keep car windows closed while driving. Minimize morning activities outdoors, a time when pollen counts are usually at their highest. Stay indoors as much as possible when pollen counts or humidity is high and on windy days when pollen tends to remain in the air longer. Use air conditioning when possible.  Many air conditioners have filters that trap the pollen spores. Use a HEPA room air filter to remove pollen form the indoor air you breathe.  Control of Mold Allergen Mold and fungi can grow on a variety of surfaces provided certain temperature and moisture conditions exist.  Outdoor molds grow on plants, decaying vegetation and soil.  The major outdoor mold, Alternaria and Cladosporium, are found in very high numbers during hot and dry conditions.  Generally, a late Summer - Fall peak is seen for common outdoor fungal spores.  Rain will temporarily lower outdoor mold spore count, but counts rise rapidly when the rainy period ends.  The most important indoor molds are Aspergillus and Penicillium.  Dark, humid and poorly ventilated basements are ideal sites for mold growth.  The next most common sites of mold growth are the bathroom and the kitchen.  Outdoor [employer] Use air conditioning and keep windows closed Avoid exposure to decaying vegetation. Avoid leaf raking. Avoid grain handling. Consider wearing a face mask if working in moldy areas.  Indoor Mold Control Maintain humidity below 50%. Clean washable surfaces with 5% bleach solution. Remove sources e.g. Contaminated carpets.   Control of Dust Mite Allergen Dust mites play a major role in allergic asthma and rhinitis. They occur in environments with high  humidity wherever human skin is found. Dust mites absorb humidity from the atmosphere (ie, they do not drink) and feed on organic matter (including shed human and animal skin). Dust mites are a microscopic type of insect that you cannot see with the naked eye. High levels of dust mites have been detected from mattresses, pillows, carpets, upholstered furniture, bed covers, clothes, soft toys and any woven material. The principal allergen of the dust mite is found in its feces. A gram of dust may contain 1,000 mites and 250,000 fecal particles. Mite antigen is easily measured in the air during house cleaning activities. Dust mites do not bite and do not cause harm to humans, other than by triggering allergies/asthma.  Ways to decrease your exposure to dust mites in your home:  1. Encase mattresses, box springs and pillows with a mite-impermeable barrier or cover  2. Wash sheets, blankets and drapes weekly in hot water (130 F) with detergent and dry  them in a dryer on the hot setting.  3. Have the room cleaned frequently with a vacuum cleaner and a damp dust-mop. For carpeting or rugs, vacuuming with a vacuum cleaner equipped with a high-efficiency particulate air (HEPA) filter. The dust mite allergic individual should not be in a room which is being cleaned and should wait 1 hour after cleaning before going into the room.  4. Do not sleep on upholstered furniture (eg, couches).  5. If possible removing carpeting, upholstered furniture and drapery from the home is ideal. Horizontal blinds should be eliminated in the rooms where the person spends the most time (bedroom, study, television room). Washable vinyl, roller-type shades are optimal.  6. Remove all non-washable stuffed toys from the bedroom. Wash stuffed toys weekly like sheets and blankets above.  7. Reduce indoor humidity to less than 50%. Inexpensive humidity monitors can be purchased at most hardware stores. Do not use a humidifier as can  make the problem worse and are not recommended.  Control of Dog or Cat Allergen Avoidance is the best way to manage a dog or cat allergy. If you have a dog or cat and are allergic to dog or cats, consider removing the dog or cat from the home. If you have a dog or cat but dont want to find it a new home, or if your family wants a pet even though someone in the household is allergic, here are some strategies that may help keep symptoms at bay:  Keep the pet out of your bedroom and restrict it to only a few rooms. Be advised that keeping the dog or cat in only one room will not limit the allergens to that room. Dont pet, hug or kiss the dog or cat; if you do, wash your hands with soap and water. High-efficiency particulate air (HEPA) cleaners run continuously in a bedroom or living room can reduce allergen levels over time. Regular use of a high-efficiency vacuum cleaner or a central vacuum can reduce allergen levels. Giving your dog or cat a bath at least once a week can reduce airborne allergen.  Control of Cockroach Allergen Cockroach allergen has been identified as an important cause of acute attacks of asthma, especially in urban settings.  There are fifty-five species of cockroach that exist in the [place], however only three, the [place], [place] species produce allergen that can affect patients with Asthma.  Allergens can be obtained from fecal particles, egg casings and secretions from cockroaches.    Remove food sources. Reduce access to water. Seal access and entry points. Spray runways with 0.5-1% Diazinon or Chlorpyrifos Blow boric acid power under stoves and refrigerator. Place bait stations (hydramethylnon) at feeding sites.    Return in about 6 months (around 01/14/2022), or if symptoms worsen or fail to improve.    Thank you for the opportunity to care for this patient.  Please do not hesitate to contact me with questions.  Gareth Morgan, FNP [facility]

## 2021-07-17 NOTE — Patient Instructions (Addendum)
Asthma Continue albuterol 2 puffs once every 4 hours as needed for cough or wheeze You may use albuterol 2 puffs 5 to 15 minutes before activity to decrease cough or wheeze  Allergic rhinitis Continue allergen avoidance measures directed toward grass pollens, weed pollens, tree pollens, dog, molds, cockroach, dust mites, and cat  Continue levocetirizine 5 mg once a day as needed for runny nose or itch.Remember to rotate to a different antihistamine about every 3 months. Some examples of over the counter antihistamines include Zyrtec (cetirizine), Xyzal (levocetirizine), Allegra (fexofenadine), and Claritin (loratidine).  Continue Flonase 1 to 2 sprays in each nostril once a day as needed for stuffy nose. In the right nostril, point the applicator out toward the right ear. In the left nostril, point the applicator out toward the left ear Consider saline nasal rinses as needed for nasal symptoms. Use this before any medicated nasal sprays for best result  Reflux Continue dietary and lifestyle modifications as listed below If you begin to experience heartburn, begin famotidine 20 mg twice a day to control reflux.   Hives Use the least amount of medications while remaining hive free Levocetirizine (Xyzal) 5 mg twice a day and famotidine (Pepcid) 20 mg twice a day. If no symptoms for 7-14 days then decrease to Levocetirizine (Xyzal) 5 mg twice a day and famotidine (Pepcid) 20 mg once a day.  If no symptoms for 7-14 days then decrease to Levocetirizine (Xyzal) 5 mg twice a day.  If no symptoms for 7-14 days then decrease to Levocetirizine (Xyzal) 5 mg once a day.  We will order some lab tests to help [place] evaluate and treat your hives  Food allergy Continue to avoid fish and shellfish for now. We will call you when the results become available. In case of an allergic reaction, take Benadryl 50 mg every 4 hours, and if life-threatening symptoms occur, inject with AuviQ 0.3 mg. We have ordered a  blood test to help [place] evaluate your food allergy. We will call you when the result becomes available  Call the clinic if this treatment plan is not working well for you  Follow up in 6 months or sooner if needed.  Reducing Pollen Exposure The American Academy of Allergy, Asthma and Immunology suggests the following steps to reduce your exposure to pollen during allergy seasons. Do not hang sheets or clothing out to dry; pollen may collect on these items. Do not mow lawns or spend time around freshly cut grass; mowing stirs up pollen. Keep windows closed at night.  Keep car windows closed while driving. Minimize morning activities outdoors, a time when pollen counts are usually at their highest. Stay indoors as much as possible when pollen counts or humidity is high and on windy days when pollen tends to remain in the air longer. Use air conditioning when possible.  Many air conditioners have filters that trap the pollen spores. Use a HEPA room air filter to remove pollen form the indoor air you breathe.  Control of Mold Allergen Mold and fungi can grow on a variety of surfaces provided certain temperature and moisture conditions exist.  Outdoor molds grow on plants, decaying vegetation and soil.  The major outdoor mold, Alternaria and Cladosporium, are found in very high numbers during hot and dry conditions.  Generally, a late Summer - Fall peak is seen for common outdoor fungal spores.  Rain will temporarily lower outdoor mold spore count, but counts rise rapidly when the rainy period ends.  The most important  indoor molds are Aspergillus and Penicillium.  Dark, humid and poorly ventilated basements are ideal sites for mold growth.  The next most common sites of mold growth are the bathroom and the kitchen.  Outdoor [employer] Use air conditioning and keep windows closed Avoid exposure to decaying vegetation. Avoid leaf raking. Avoid grain handling. Consider wearing a face mask if working  in moldy areas.  Indoor Mold Control Maintain humidity below 50%. Clean washable surfaces with 5% bleach solution. Remove sources e.g. Contaminated carpets.   Control of Dust Mite Allergen Dust mites play a major role in allergic asthma and rhinitis. They occur in environments with high humidity wherever human skin is found. Dust mites absorb humidity from the atmosphere (ie, they do not drink) and feed on organic matter (including shed human and animal skin). Dust mites are a microscopic type of insect that you cannot see with the naked eye. High levels of dust mites have been detected from mattresses, pillows, carpets, upholstered furniture, bed covers, clothes, soft toys and any woven material. The principal allergen of the dust mite is found in its feces. A gram of dust may contain 1,000 mites and 250,000 fecal particles. Mite antigen is easily measured in the air during house cleaning activities. Dust mites do not bite and do not cause harm to humans, other than by triggering allergies/asthma.  Ways to decrease your exposure to dust mites in your home:  1. Encase mattresses, box springs and pillows with a mite-impermeable barrier or cover  2. Wash sheets, blankets and drapes weekly in hot water (130 F) with detergent and dry them in a dryer on the hot setting.  3. Have the room cleaned frequently with a vacuum cleaner and a damp dust-mop. For carpeting or rugs, vacuuming with a vacuum cleaner equipped with a high-efficiency particulate air (HEPA) filter. The dust mite allergic individual should not be in a room which is being cleaned and should wait 1 hour after cleaning before going into the room.  4. Do not sleep on upholstered furniture (eg, couches).  5. If possible removing carpeting, upholstered furniture and drapery from the home is ideal. Horizontal blinds should be eliminated in the rooms where the person spends the most time (bedroom, study, television room). Washable vinyl,  roller-type shades are optimal.  6. Remove all non-washable stuffed toys from the bedroom. Wash stuffed toys weekly like sheets and blankets above.  7. Reduce indoor humidity to less than 50%. Inexpensive humidity monitors can be purchased at most hardware stores. Do not use a humidifier as can make the problem worse and are not recommended.  Control of Dog or Cat Allergen Avoidance is the best way to manage a dog or cat allergy. If you have a dog or cat and are allergic to dog or cats, consider removing the dog or cat from the home. If you have a dog or cat but dont want to find it a new home, or if your family wants a pet even though someone in the household is allergic, here are some strategies that may help keep symptoms at bay:  Keep the pet out of your bedroom and restrict it to only a few rooms. Be advised that keeping the dog or cat in only one room will not limit the allergens to that room. Dont pet, hug or kiss the dog or cat; if you do, wash your hands with soap and water. High-efficiency particulate air (HEPA) cleaners run continuously in a bedroom or living room can reduce  allergen levels over time. Regular use of a high-efficiency vacuum cleaner or a central vacuum can reduce allergen levels. Giving your dog or cat a bath at least once a week can reduce airborne allergen.  Control of Cockroach Allergen Cockroach allergen has been identified as an important cause of acute attacks of asthma, especially in urban settings.  There are fifty-five species of cockroach that exist in the [place], however only three, the [place], [place] species produce allergen that can affect patients with Asthma.  Allergens can be obtained from fecal particles, egg casings and secretions from cockroaches.    Remove food sources. Reduce access to water. Seal access and entry points. Spray runways with 0.5-1% Diazinon or Chlorpyrifos Blow boric acid power under stoves and  refrigerator. Place bait stations (hydramethylnon) at feeding sites.

## 2021-07-17 NOTE — Progress Notes (Signed)
[address] [place] [address] Dept: [phone]  FOLLOW UP NOTE  Patient ID: Adam Pace, male    DOB: Apr 06, 1976  Age: 46 y.o. MRN: [id number] Date of Office Visit: 07/17/2021  Assessment  Chief Complaint: Follow-up (Pt he have been doing well, his skin have improved, he's not itching as much, but he started dry coughing and his nose have been dry as well.), Asthma, and Allergic Rhinitis   HPI Adam Pace is a 46 year old male who presents the clinic for follow-up visit.  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  At that time, he started on allergen immunotherapy directed toward grass pollen, weed pollen, tree pollen, pets, mold, cockroach, and dust mites.  He reports that after each injection he began to experience an increase in hives and itching without concomitant cardiopulmonary or gastrointestinal symptoms.  His last allergen immunotherapy injection was on 06/25/2021.  At today's visit, he reports his asthma has been moderately well controlled with intermittent dry cough occurring over the last 2 days.  He denies shortness of breath and wheeze with rest and activity.  He has not used his albuterol since his last visit to this clinic.  Allergic rhinitis is reported as moderately well controlled with occasional nasal congestion as the main symptom.  He reports experiencing nasal congestion year-round with no seasonal variation.  He continues levocetirizine 5 mg once a day and is not currently using nasal saline rinses or a nasal steroid spray.  Allergic conjunctivitis is reported as moderately well controlled with red and itchy eyes occurring occasionally.  He is not currently using an allergy eyedrop.  Atopic dermatitis is reported as moderately well controlled and he continues to follow with a dermatology specialist.  Reflux is  reported as well controlled with no medical intervention at this time.  He reports that he did stop drinking orange juice altogether.  Papular urticaria is reported as poorly controlled with hive breakouts occurring randomly several times a week.  He reports that he follows the same routine daily and some days he experiences hives and some days he does not have hives.  He denies new foods, new medications, pets, or insect stings.  He does continue levocetirizine 5 mg once a day.  He has not tried to increase this dose and also has not tried famotidine for hives.  He does report a perceived increase in the amount of hives he was experiencing while receiving allergen immunotherapy.  He has decided to stop allergen immunotherapy at this point.  His current medications are listed in the chart.   Drug Allergies:  Allergies  Allergen Reactions   Shellfish Allergy Anaphylaxis    Mouth swelling   Amoxicillin     Hives    Aspirin    Nsaids     Hives    Olmesartan Nausea Only    Chest pain   Penicillins    Tolmetin Rash    Hives    Physical Exam: BP 116/80    Pulse 91    Temp 97.9 F (36.6 C) (Temporal)    Resp 17    Ht 5\' 9"  (1.753 m)    SpO2 98%    BMI 29.86 kg/m    Physical Exam Vitals reviewed.  Constitutional:      Appearance: Normal appearance.  HENT:     Head: Normocephalic and atraumatic.  Right Ear: Tympanic membrane normal.     Left Ear: Tympanic membrane normal.     Nose:     Comments: Bilateral nares slightly erythematous with clear nasal drainage noted.  Pharynx normal.  Ears normal.  Eyes normal.    Mouth/Throat:     Pharynx: Oropharynx is clear.  Eyes:     Conjunctiva/sclera: Conjunctivae normal.  Cardiovascular:     Rate and Rhythm: Normal rate and regular rhythm.     Heart sounds: Normal heart sounds. No murmur heard. Pulmonary:     Effort: Pulmonary effort is normal.     Breath sounds: Normal breath sounds.     Comments: Lungs clear to  auscultation Musculoskeletal:        General: Normal range of motion.     Cervical back: Normal range of motion and neck supple.  Skin:    General: Skin is warm and dry.     Comments: Patient demonstrating dermatographia during the clinic visit  Neurological:     Mental Status: He is alert and oriented to person, place, and time.  Psychiatric:        Mood and Affect: Mood normal.        Behavior: Behavior normal.        Thought Content: Thought content normal.        Judgment: Judgment normal.    Assessment and Plan: 1. Mild intermittent asthma without complication   2. Seasonal and perennial allergic rhinitis   3. Allergic conjunctivitis of both eyes   4. Papular urticaria   5. Gastroesophageal reflux disease, unspecified whether esophagitis present     Patient Instructions  Asthma Continue albuterol 2 puffs once every 4 hours as needed for cough or wheeze You may use albuterol 2 puffs 5 to 15 minutes before activity to decrease cough or wheeze  Allergic rhinitis Continue allergen avoidance measures directed toward grass pollens, weed pollens, tree pollens, dog, molds, cockroach, dust mites, and cat  Continue levocetirizine 5 mg once a day as needed for runny nose or itch.Remember to rotate to a different antihistamine about every 3 months. Some examples of over the counter antihistamines include Zyrtec (cetirizine), Xyzal (levocetirizine), Allegra (fexofenadine), and Claritin (loratidine).  Continue Flonase 1 to 2 sprays in each nostril once a day as needed for stuffy nose. In the right nostril, point the applicator out toward the right ear. In the left nostril, point the applicator out toward the left ear Consider saline nasal rinses as needed for nasal symptoms. Use this before any medicated nasal sprays for best result  Reflux Continue dietary and lifestyle modifications as listed below If you begin to experience heartburn, begin famotidine 20 mg twice a day to control  reflux.   Hives Use the least amount of medications while remaining hive free Levocetirizine (Xyzal) 5 mg twice a day and famotidine (Pepcid) 20 mg twice a day. If no symptoms for 7-14 days then decrease to Levocetirizine (Xyzal) 5 mg twice a day and famotidine (Pepcid) 20 mg once a day.  If no symptoms for 7-14 days then decrease to Levocetirizine (Xyzal) 5 mg twice a day.  If no symptoms for 7-14 days then decrease to Levocetirizine (Xyzal) 5 mg once a day.  We will order some lab tests to help [place] evaluate and treat your hives  Food allergy Continue to avoid fish and shellfish for now. We will call you when the results become available. In case of an allergic reaction, take Benadryl 50 mg every 4 hours, and  if life-threatening symptoms occur, inject with AuviQ 0.3 mg. We have ordered a blood test to help [place] evaluate your food allergy. We will call you when the result becomes available  Call the clinic if this treatment plan is not working well for you  Follow up in 6 months or sooner if needed.   Return in about 6 months (around 01/14/2022), or if symptoms worsen or fail to improve.    Thank you for the opportunity to care for this patient.  Please do not hesitate to contact me with questions.  Thermon Leyland, FNP [facility]

## 2021-07-26 LAB — ALPHA-GAL PANEL
Allergen Lamb IgE: 0.19 kU/L — AB
Beef IgE: 0.21 kU/L — AB
IgE (Immunoglobulin E), Serum: 128 IU/mL (ref 6–495)
O215-IgE Alpha-Gal: <0.1 kU/L
Pork IgE: <0.1 kU/L

## 2021-07-26 LAB — CHRONIC URTICARIA: cu index: 2.7 (ref <10)

## 2021-07-26 LAB — TRYPTASE: Tryptase: 4.8 ug/L (ref 2.2–13.2)

## 2021-07-29 NOTE — Progress Notes (Signed)
Can you please let this patient know that his lab results indicated a slight sensitivity to beef and lamb. Can you please ask him if he eats beef and lamb in his diet? Please ask about the frequency of hives at this time. All other labs were within the normal range. Thank you

## 2021-08-18 ENCOUNTER — Emergency Department (HOSPITAL_BASED_OUTPATIENT_CLINIC_OR_DEPARTMENT_OTHER)
Admission: EM | Admit: 2021-08-18 | Discharge: 2021-08-18 | Disposition: A | Discharge status: Home / Self Care | Payer: BC Managed Care – PPO | Attending: Emergency Medicine | Admitting: Emergency Medicine

## 2021-08-18 ENCOUNTER — Encounter (HOSPITAL_BASED_OUTPATIENT_CLINIC_OR_DEPARTMENT_OTHER): Payer: Self-pay | Admitting: Emergency Medicine

## 2021-08-18 ENCOUNTER — Emergency Department (HOSPITAL_BASED_OUTPATIENT_CLINIC_OR_DEPARTMENT_OTHER): Payer: BC Managed Care – PPO

## 2021-08-18 ENCOUNTER — Other Ambulatory Visit: Payer: Self-pay

## 2021-08-18 DIAGNOSIS — K760 Fatty (change of) liver, not elsewhere classified: Secondary | ICD-10-CM | POA: Diagnosis not present

## 2021-08-18 DIAGNOSIS — K5790 Diverticulosis of intestine, part unspecified, without perforation or abscess without bleeding: Secondary | ICD-10-CM | POA: Diagnosis not present

## 2021-08-18 DIAGNOSIS — R7401 Elevation of levels of liver transaminase levels: Secondary | ICD-10-CM

## 2021-08-18 DIAGNOSIS — I10 Essential (primary) hypertension: Secondary | ICD-10-CM | POA: Diagnosis not present

## 2021-08-18 DIAGNOSIS — Z7951 Long term (current) use of inhaled steroids: Secondary | ICD-10-CM | POA: Diagnosis not present

## 2021-08-18 DIAGNOSIS — J45909 Unspecified asthma, uncomplicated: Secondary | ICD-10-CM | POA: Diagnosis not present

## 2021-08-18 DIAGNOSIS — R1013 Epigastric pain: Secondary | ICD-10-CM | POA: Diagnosis present

## 2021-08-18 LAB — COMPREHENSIVE METABOLIC PANEL
ALT: 56 U/L — ABNORMAL HIGH (ref 0–44)
AST: 30 U/L (ref 15–41)
Albumin: 3.9 g/dL (ref 3.5–5.0)
Alkaline Phosphatase: 47 U/L (ref 38–126)
Anion gap: 7 (ref 5–15)
BUN: 20 mg/dL (ref 6–20)
CO2: 25 mmol/L (ref 22–32)
Calcium: 8.8 mg/dL — ABNORMAL LOW (ref 8.9–10.3)
Chloride: 107 mmol/L (ref 98–111)
Creatinine, Ser: 0.96 mg/dL (ref 0.61–1.24)
GFR, Estimated: >60 mL/min (ref >60)
Glucose, Bld: 118 mg/dL — ABNORMAL HIGH (ref 70–99)
Potassium: 3.9 mmol/L (ref 3.5–5.1)
Sodium: 139 mmol/L (ref 135–145)
Total Bilirubin: 0.3 mg/dL (ref 0.3–1.2)
Total Protein: 6.9 g/dL (ref 6.5–8.1)

## 2021-08-18 LAB — CBC WITH DIFFERENTIAL/PLATELET
Abs Immature Granulocytes: 0.02 10*3/uL (ref 0.00–0.07)
Basophils Absolute: 0 10*3/uL (ref 0.0–0.1)
Basophils Relative: 0 %
Eosinophils Absolute: 0.2 10*3/uL (ref 0.0–0.5)
Eosinophils Relative: 3 %
HCT: 46.9 % (ref 39.0–52.0)
Hemoglobin: 16 g/dL (ref 13.0–17.0)
Immature Granulocytes: 0 %
Lymphocytes Relative: 30 %
Lymphs Abs: 2.6 10*3/uL (ref 0.7–4.0)
MCH: 30.6 pg (ref 26.0–34.0)
MCHC: 34.1 g/dL (ref 30.0–36.0)
MCV: 89.7 fL (ref 80.0–100.0)
Monocytes Absolute: 0.9 10*3/uL (ref 0.1–1.0)
Monocytes Relative: 10 %
Neutro Abs: 5.1 10*3/uL (ref 1.7–7.7)
Neutrophils Relative %: 57 %
Platelets: 214 10*3/uL (ref 150–400)
RBC: 5.23 MIL/uL (ref 4.22–5.81)
RDW: 11.9 % (ref 11.5–15.5)
WBC: 8.9 10*3/uL (ref 4.0–10.5)
nRBC: 0 % (ref 0.0–0.2)

## 2021-08-18 LAB — LIPASE, BLOOD: Lipase: 40 U/L (ref 11–51)

## 2021-08-18 MED ORDER — ALUM & MAG HYDROXIDE-SIMETH 200-200-20 MG/5ML PO SUSP
30.0000 mL | Freq: Once | ORAL | Status: AC
  Administered 2021-08-18: 30 mL via ORAL
  Filled 2021-08-18: qty 30

## 2021-08-18 MED ORDER — PANTOPRAZOLE SODIUM 20 MG PO TBEC: 20.0000 mg | DELAYED_RELEASE_TABLET | Freq: Every day | ORAL | 0 refills | Status: DC

## 2021-08-18 MED ORDER — PANTOPRAZOLE SODIUM 40 MG IV SOLR
40.0000 mg | Freq: Once | INTRAVENOUS | Status: AC
  Administered 2021-08-18: 40 mg via INTRAVENOUS
  Filled 2021-08-18: qty 10

## 2021-08-18 MED ORDER — MORPHINE SULFATE (PF) 4 MG/ML IV SOLN: 4.0000 mg | Freq: Once | INTRAVENOUS | Status: DC

## 2021-08-18 MED ORDER — IOHEXOL 350 MG/ML SOLN
100.0000 mL | Freq: Once | INTRAVENOUS | Status: AC | PRN
  Administered 2021-08-18: 100 mL via INTRAVENOUS

## 2021-08-18 MED ORDER — ONDANSETRON HCL 4 MG/2ML IJ SOLN
4.0000 mg | Freq: Once | INTRAMUSCULAR | Status: AC
  Administered 2021-08-18: 4 mg via INTRAVENOUS
  Filled 2021-08-18: qty 2

## 2021-08-18 MED ORDER — ONDANSETRON HCL 4 MG PO TABS: 4.0000 mg | ORAL_TABLET | Freq: Four times a day (QID) | ORAL | 0 refills | Status: DC

## 2021-08-18 NOTE — ED Triage Notes (Signed)
Reports sudden onset epigastric pain at 0100. Denies n/v or other sx.  ?

## 2021-08-18 NOTE — Discharge Instructions (Signed)
Follow-up with a general surgeon to discuss possible further treatment of the small stone in the cystic duct.  Return to the emergency room if you start having trouble with fevers pain or any recurrent symptoms ?

## 2021-08-18 NOTE — ED Notes (Signed)
Patient transported to CT 

## 2021-08-18 NOTE — ED Provider Notes (Signed)
?[facility] [facility] EMERGENCY DEPARTMENT ?Provider Note ? ? ?CSN: [id number] ?Arrival date & time: 08/18/21  [id number] ? ?  ? ?History ? ?Chief Complaint  ?Patient presents with  ? Abdominal Pain  ? ? ?Adam Pace is a 46 y.o. male. ? ?The history is provided by the patient.  ?Abdominal Pain ?He has history of asthma, hypertension, hyperlipidemia, GERD and comes in complaining of sudden onset of epigastric pain at about 1 AM.  Pain does radiate slightly to the back.  There is no radiation to the chest or shoulders.  He rates the pain at 8/10.  Nothing makes it better, nothing makes it worse.  He denies nausea or vomiting or diarrhea.  He has never had pain like this before.  He denies aspirin or NSAID use, ethanol use, tobacco use. ?  ?Home Medications ?Prior to Admission medications   ?Medication Sig Start Date End Date Taking? Authorizing Provider  ?albuterol (VENTOLIN HFA) 108 (90 Base) MCG/ACT inhaler Inhale 2 puffs into the lungs every 4 (four) hours as needed for wheezing or shortness of breath. 03/14/21   Tonny Bollmanennis, Erin, MD  ?ammonium lactate (AMLACTIN) 12 % cream Apply topically 2 (two) times daily. 03/20/21   [provider]  ?EPINEPHrine (AUVI-Q) 0.3 mg/0.3 mL IJ SOAJ injection Use as directed for severe allergic reactions 03/14/21   Tonny Bollmanennis, Erin, MD  ?fluticasone [facility](FLONASE) 50 MCG/ACT nasal spray 1-2 sprays per nostril once a day for a runny nose ?Patient not taking: Reported on 07/17/2021 03/14/21   Tonny Bollmanennis, Erin, MD  ?levocetirizine (XYZAL) 5 MG tablet TAKE 1 TABLET(5 MG) BY MOUTH DAILY AS NEEDED FOR ALLERGIES 06/19/21   Tonny Bollmanennis, Erin, MD  ?Olopatadine HCl (PATADAY) 0.2 % SOLN Place 1 drop into both eyes daily as needed. ?Patient not taking: Reported on 07/17/2021 05/06/18   Cristal FordBobbitt, Ralph Carter, MD  ?omeprazole (PRILOSEC) 20 MG capsule  08/04/18   [provider]  ?valsartan (DIOVAN) 160 MG tablet  04/27/17   [provider]  ?   ? ?Allergies    ?Shellfish allergy, Amoxicillin, Aspirin,  Beef-derived products, Nsaids, Olmesartan, Penicillins, and Tolmetin   ? ?Review of Systems   ?Review of Systems  ?Gastrointestinal:  Positive for abdominal pain.  ?All other systems reviewed and are negative. ? ?Physical Exam ?Updated Vital Signs ?BP (!) 141/113 (BP Location: Right Arm)   Pulse 83   Temp 98.9 ?F (37.2 ?C) (Oral)   Resp 20   Ht 5' 8.5" (1.74 m)   Wt 90.7 kg   SpO2 98%   BMI 29.97 kg/m?  ?Physical Exam ?Vitals and nursing note reviewed.  ?46 year old male, resting comfortably and in no acute distress. Vital signs are significant for elevated blood pressure. Oxygen saturation is 98%, which is normal. ?Head is normocephalic and atraumatic. PERRLA, EOMI. Oropharynx is clear. ?Neck is nontender and supple without adenopathy or JVD. ?Back is nontender and there is no CVA tenderness. ?Lungs are clear without rales, wheezes, or rhonchi. ?Chest is nontender. ?Heart has regular rate and rhythm without murmur. ?Abdomen is soft, flat, with mild epigastric tenderness.  There is no rebound or guarding.  Murphy sign is negative.  Peristalsis is diminished. ?Extremities have no cyanosis or edema, full range of motion is present. ?Skin is warm and dry without rash. ?Neurologic: Mental status is normal, cranial nerves are intact, moves all extremities equally. ? ?ED Results / Procedures / Treatments   ?Labs ?(all labs ordered are listed, but only abnormal results are displayed) ?Labs Reviewed  ?COMPREHENSIVE  METABOLIC PANEL - Abnormal; Notable for the following components:  ?    Result Value  ? Glucose, Bld 118 (*)   ? Calcium 8.8 (*)   ? ALT 56 (*)   ? All other components within normal limits  ?LIPASE, BLOOD  ?CBC WITH DIFFERENTIAL/PLATELET  ? ? ?EKG ?EKG Interpretation ? ?Date/Time:  Sunday August 18 2021 03:14:11 EDT ?Ventricular Rate:  77 ?PR Interval:  145 ?QRS Duration: 93 ?QT Interval:  373 ?QTC Calculation: 423 ?R Axis:   99 ?Text Interpretation: Sinus rhythm Consider right ventricular hypertrophy When  compared with ECG of 08/26/2000, No significant change was found Confirmed by Dione Booze ([id number]) on 08/18/2021 3:37:30 AM ? ?Radiology ?CT ABDOMEN PELVIS W CONTRAST ? ?Result Date: 08/18/2021 ?CLINICAL DATA:  Epigastric pain sudden onset. EXAM: CT ABDOMEN AND PELVIS WITH CONTRAST TECHNIQUE: Multidetector CT imaging of the abdomen and pelvis was performed using the standard protocol following bolus administration of intravenous contrast. RADIATION DOSE REDUCTION: This exam was performed according to the departmental dose-optimization program which includes automated exposure control, adjustment of the mA and/or kV according to patient size and/or use of iterative reconstruction technique. CONTRAST:  OMNIPAQUE IOHEXOL 350 MG/ML SOLN COMPARISON:  CT without contrast 04/07/2010. FINDINGS: Lower chest: Acute abnormality is seen. Mild elevation right hemidiaphragm. Hepatobiliary: 16.2 cm in length moderately steatotic liver without mass. The gallbladder and bile ducts unremarkable, but there is a 4.5 mm stone believed lodged in the cystic duct not seen previously with the gallbladder upper-normal in distention. Pancreas: Unremarkable. Spleen: Unremarkable. Adrenals/Urinary Tract: There is no adrenal mass. There are small hypodensities scattered within the bilateral kidneys which too small to characterize and may or may not have been present on the prior noncontrast exam. There is no urinary stone or obstruction. There is a normal bladder thickness. Stomach/Bowel: No dilatation or wall thickening including the appendix. Diverticula scattered throughout the transverse and left colon without evidence of diverticulitis. Vascular/Lymphatic: No significant vascular findings are present. No enlarged abdominal or pelvic lymph nodes. Reproductive: No prostatomegaly. Other: There is no free air, hemorrhage or fluid. There is no incarcerated hernia. Musculoskeletal: No acute or significant osseous findings. Bridging osteophytes  left SI joint. Mild lumbar spondylosis. IMPRESSION: 1. There is a 4.5 mm calculus inseparable from the plane of the cystic duct and is believed to be lodged within it. The gallbladder wall is not grossly thickened and no calcified stones are seen in the gallbladder. There is no biliary dilatation. 2. Fatty liver. 3. Small renal hypodensities which are too small characterize but likely cysts. 4. Diverticulosis without diverticulitis. Electronically Signed   By: Almira Bar M.D.   On: 08/18/2021 06:30   ? ?Procedures ?Procedures  ?Cardiac monitor shows normal sinus rhythm, per my interpretation. ? ?Medications Ordered in ED ?Medications  ?ondansetron (ZOFRAN) injection 4 mg (4 mg Intravenous Given 08/18/21 0402)  ?alum & mag hydroxide-simeth (MAALOX/MYLANTA) 200-200-20 MG/5ML suspension 30 mL (30 mLs Oral Given 08/18/21 0406)  ?pantoprazole (PROTONIX) injection 40 mg (40 mg Intravenous Given 08/18/21 0402)  ?iohexol (OMNIPAQUE) 350 MG/ML injection 100 mL (100 mLs Intravenous Contrast Given 08/18/21 0555)  ? ? ?ED Course/ Medical Decision Making/ A&P ?  ?                        ?Medical Decision Making ?Amount and/or Complexity of Data Reviewed ?Labs: ordered. ?Radiology: ordered. ? ?Risk ?OTC drugs. ?Prescription drug management. ? ? ?Epigastric pain of uncertain cause.  Differential diagnosis includes,  but is not limited to, peptic ulcer disease, GERD, gastritis, diverticulitis, cholecystitis, pancreatitis, abdominal aortic aneurysm, perforated viscus.  Old records are reviewed, and he has no relevant past visits.  He did have a CT of abdomen and pelvis in 2011 at which time he had a left ureteral calculus but remainder of exam was normal.  We will check screening labs including comprehensive metabolic panel, lipase and will send for CT of abdomen and pelvis.  He will also be given a therapeutic trial of an antacid and pantoprazole. ? ?Labs showed minimal elevation of ALT which is actually less than had been present  last time it was checked 1 year ago (results present in care everywhere), and is not felt to be clinically significant.  WBC is normal and with normal differential.  CT scan shows a 4.5 mm calculus that seems

## 2021-08-18 NOTE — ED Provider Notes (Signed)
Initially seen by Dr. Preston Fleeting.  Please see his note.  Plan was to follow-up on the patient's ultrasound following a CT scan that suggested possible stone in the cystic duct.  Ultrasound does not show any evidence of gallstones.  No other abnormalities noted.  Discussed the case with GI on-call, Doug Sou.  No other studies indicated at this time.  Appropriate for outpatient management. ? ?Patient is pain-free at this time.  He has no discomfort.  Will have him follow-up with general surgery as an outpatient ?  ?Linwood Dibbles, MD ?08/18/21 1219 ? ?

## 2021-09-17 ENCOUNTER — Other Ambulatory Visit: Payer: Self-pay | Admitting: Internal Medicine

## 2022-01-08 ENCOUNTER — Encounter: Payer: Self-pay | Admitting: Internal Medicine

## 2022-01-08 ENCOUNTER — Ambulatory Visit (INDEPENDENT_AMBULATORY_CARE_PROVIDER_SITE_OTHER): Payer: BC Managed Care – PPO | Admitting: Internal Medicine

## 2022-01-08 VITALS — BP 120/70 | HR 84 | Temp 97.7°F | Resp 16

## 2022-01-08 DIAGNOSIS — L501 Idiopathic urticaria: Secondary | ICD-10-CM

## 2022-01-08 DIAGNOSIS — K219 Gastro-esophageal reflux disease without esophagitis: Secondary | ICD-10-CM

## 2022-01-08 DIAGNOSIS — T7800XD Anaphylactic reaction due to unspecified food, subsequent encounter: Secondary | ICD-10-CM

## 2022-01-08 DIAGNOSIS — G4719 Other hypersomnia: Secondary | ICD-10-CM

## 2022-01-08 DIAGNOSIS — J4520 Mild intermittent asthma, uncomplicated: Secondary | ICD-10-CM

## 2022-01-08 DIAGNOSIS — J302 Other seasonal allergic rhinitis: Secondary | ICD-10-CM

## 2022-01-08 DIAGNOSIS — J3089 Other allergic rhinitis: Secondary | ICD-10-CM | POA: Diagnosis not present

## 2022-01-08 DIAGNOSIS — T7800XA Anaphylactic reaction due to unspecified food, initial encounter: Secondary | ICD-10-CM

## 2022-01-08 MED ORDER — LEVOCETIRIZINE DIHYDROCHLORIDE 5 MG PO TABS: 5.0000 mg | ORAL_TABLET | Freq: Every day | ORAL | 0 refills | Status: DC | PRN

## 2022-01-08 NOTE — Progress Notes (Signed)
Follow Up Note  RE: Theron Cumbie MRN: [id number] DOB: [birth year] Date of Office Visit: 01/08/2022  Referring provider: Loyal Jacobson, MD Primary care provider: Loyal Jacobson, MD  Chief Complaint: Food Intolerance  History of Present Illness: I had the pleasure of seeing Zymere Patlan for a follow up visit at the [facility] on 01/08/2022. He is a 46 y.o. male, who is being followed for asthma, allergic rhinitis, reflux, hives, food allergy. His previous allergy office visit was on 07/17/2021 with Thermon Leyland, FNP. Today is a regular follow up visit.  History obtained from patient, chart review.  ASTHMA - Medical therapy: none  - Rescue inhaler use: rare use  - Symptoms: denies cough, wheeze, dyspnea  - Exacerbation history: 0 ABX for respiratory illness since last visit, 0 OCS, 0ED, 0 UC visits in the past year  - ACT: 25 /25 - Adverse effects of medication: denies  - Previous FEV1: 3.07 L, 86% - Biologic Labs denies   Allergic  Rhinitis: current therapy: levocetirizine 5mg  daily ,  symptoms improved symptoms include:  DENIES:, nasal congestion, rhinorrhea, post nasal drainage, sneezing, watery eyes, itchy eyes, and itchy nose Previous allergy testing:  grass pollens, weed pollens, tree pollens, dog, molds, cockroach, dust mites, and cat  History of reflux/heartburn:  yes, well controlled  Interested in Allergy Immunotherapy: no  Food Allergy:  - he is able to eat shrimp, crab, lobster, but still avoiding crawfish, able to tolerate fin fish -0 accidental exposures - 0 use of epinephrine -Previous testing: 11/22: Slightly positive fish mix, negative to individual fish and shellfish   GERD Resolved   Chronic Urticaria  Well controlled a long as he takes his levocetirizine LABS: CU index and tryptase normal,  Will get trigger by exposure to chemicals and perfumes and with dermatographism    Assessment and Plan: Oryon is a 46 y.o. male with: Mild  intermittent asthma without complication  Idiopathic urticaria  Seasonal and perennial allergic rhinitis  Gastroesophageal reflux disease without esophagitis  Allergy with anaphylaxis due to food  Excessive daytime sleepiness Plan: Patient Instructions  Asthma: well controlled  Continue albuterol 2 puffs once every 4 hours as needed for cough or wheeze You may use albuterol 2 puffs 5 to 15 minutes before activity to decrease cough or wheeze  Allergic rhinitis: well controlled  Continue allergen avoidance measures directed toward grass pollens, weed pollens, tree pollens, dog, molds, cockroach, dust mites, and cat  Continue levocetirizine 5 mg once a day as needed for runny nose or itch. Continue Flonase 1 to 2 sprays in each nostril once a day as needed for stuffy nose. In the right nostril, point the applicator out toward the right ear. In the left nostril, point the applicator out toward the left ear Consider saline nasal rinses as needed for nasal symptoms. Use this before any medicated nasal sprays for best result  Reflux: well controlled Continue dietary and lifestyle modifications as listed below If you begin to experience heartburn, begin famotidine 20 mg twice a day to control reflux.   Hives- controlled  Continues levocetirizine 5mg  daily to prevent hives   Food allergy: slowly resolving  Continue to avoid crawfish and try to identify the one fish that you had a reaction too  -If you are eating foods without symptoms, you can continue to ingest without concerns  -continue carry Auvi-Q and follow anaphylaaxis action plan  Talk to your primary care about getting a sleep study for the daytime sleepiness  and snoring   Call the clinic if this treatment plan is not working well for you  Follow up in 6 months or sooner if needed.    No follow-ups on file.  Meds ordered this encounter  Medications   levocetirizine (XYZAL) 5 MG tablet    Sig: Take 1 tablet (5 mg  total) by mouth daily as needed for allergies.    Dispense:  90 tablet    Refill:  0    Lab Orders  No laboratory test(s) ordered today   Diagnostics: None performed   Medication List:  Current Outpatient Medications  Medication Sig Dispense Refill   albuterol (VENTOLIN HFA) 108 (90 Base) MCG/ACT inhaler Inhale 2 puffs into the lungs every 4 (four) hours as needed for wheezing or shortness of breath. 18 g 1   EPINEPHrine (AUVI-Q) 0.3 mg/0.3 mL IJ SOAJ injection Use as directed for severe allergic reactions 2 each 1   pantoprazole (PROTONIX) 20 MG tablet Take 1 tablet (20 mg total) by mouth daily. 30 tablet 0   valsartan (DIOVAN) 160 MG tablet   4   ammonium lactate (AMLACTIN) 12 % cream Apply topically 2 (two) times daily. (Patient not taking: Reported on 01/08/2022)     fluticasone (FLONASE) 50 MCG/ACT nasal spray 1-2 sprays per nostril once a day for a runny nose (Patient not taking: Reported on 07/17/2021) 16 g 5   levocetirizine (XYZAL) 5 MG tablet Take 1 tablet (5 mg total) by mouth daily as needed for allergies. 90 tablet 0   Olopatadine HCl (PATADAY) 0.2 % SOLN Place 1 drop into both eyes daily as needed. (Patient not taking: Reported on 07/17/2021) 1 Bottle 5   omeprazole (PRILOSEC) 20 MG capsule  (Patient not taking: Reported on 01/08/2022)     ondansetron (ZOFRAN) 4 MG tablet Take 1 tablet (4 mg total) by mouth every 6 (six) hours. (Patient not taking: Reported on 01/08/2022) 12 tablet 0   No current facility-administered medications for this visit.   Allergies: Allergies  Allergen Reactions   Shellfish Allergy Anaphylaxis    Mouth swelling   Amoxicillin     Hives    Aspirin    Beef-Derived Products    Nsaids     Hives    Olmesartan Nausea Only    Chest pain   Penicillins    Tolmetin Rash    Hives   I reviewed his past medical history, social history, family history, and environmental history and no significant changes have been reported from his previous  visit.  ROS: All others negative except as noted per HPI.   Objective: BP 120/70   Pulse 84   Temp 97.7 F (36.5 C) (Temporal)   Resp 16   SpO2 98%  There is no height or weight on file to calculate BMI. General Appearance:  Appears tired, cooperative, no distress, appears stated age  Head:  Normocephalic, without obvious abnormality, atraumatic  Eyes:  Conjunctiva clear, EOM's intact  Nose: Nares normal, hypertrophic turbinates, no visible anterior polyps, and septum midline  Throat: Lips, tongue normal; teeth and gums normal, normal posterior oropharynx and no tonsillar exudate  Neck: Supple, symmetrical  Lungs:   clear to auscultation bilaterally, Respirations unlabored, no coughing  Heart:  regular rate and rhythm and no murmur, Appears well perfused  Extremities: No edema  Skin: Skin color, texture, turgor normal, no rashes or lesions on visualized portions of skin   Neurologic: No gross deficits   Previous notes and tests were reviewed. The plan  was reviewed with the patient/family, and all questions/concerned were addressed.  It was my pleasure to see Onesimo today and participate in his care. Please feel free to contact me with any questions or concerns.  Sincerely,  Ferol Luz, MD  Allergy & Immunology  [facility] Office: [phone]

## 2022-01-08 NOTE — Patient Instructions (Addendum)
Asthma: well controlled  Continue albuterol 2 puffs once every 4 hours as needed for cough or wheeze You may use albuterol 2 puffs 5 to 15 minutes before activity to decrease cough or wheeze  Allergic rhinitis: well controlled  Continue allergen avoidance measures directed toward grass pollens, weed pollens, tree pollens, dog, molds, cockroach, dust mites, and cat  Continue levocetirizine 5 mg once a day as needed for runny nose or itch. Continue Flonase 1 to 2 sprays in each nostril once a day as needed for stuffy nose. In the right nostril, point the applicator out toward the right ear. In the left nostril, point the applicator out toward the left ear Consider saline nasal rinses as needed for nasal symptoms. Use this before any medicated nasal sprays for best result  Reflux: well controlled Continue dietary and lifestyle modifications as listed below If you begin to experience heartburn, begin famotidine 20 mg twice a day to control reflux.   Hives- controlled  Continues levocetirizine 5mg  daily to prevent hives   Food allergy: slowly resolving  Continue to avoid crawfish and try to identify the one fish that you had a reaction too  -If you are eating foods without symptoms, you can continue to ingest without concerns  -continue carry Auvi-Q and follow anaphylaaxis action plan  Talk to your primary care about getting a sleep study for the daytime sleepiness and snoring   Call the clinic if this treatment plan is not working well for you  Follow up in 6 months or sooner if needed.

## 2022-04-08 ENCOUNTER — Other Ambulatory Visit: Payer: Self-pay | Admitting: Internal Medicine

## 2022-07-09 ENCOUNTER — Ambulatory Visit (INDEPENDENT_AMBULATORY_CARE_PROVIDER_SITE_OTHER): Payer: BC Managed Care – PPO | Admitting: Internal Medicine

## 2022-07-09 ENCOUNTER — Encounter: Payer: Self-pay | Admitting: Internal Medicine

## 2022-07-09 VITALS — BP 108/68 | HR 94 | Temp 97.6°F | Resp 17 | Wt 195.9 lb

## 2022-07-09 DIAGNOSIS — J4520 Mild intermittent asthma, uncomplicated: Secondary | ICD-10-CM | POA: Diagnosis not present

## 2022-07-09 DIAGNOSIS — L501 Idiopathic urticaria: Secondary | ICD-10-CM

## 2022-07-09 DIAGNOSIS — L301 Dyshidrosis [pompholyx]: Secondary | ICD-10-CM

## 2022-07-09 DIAGNOSIS — K219 Gastro-esophageal reflux disease without esophagitis: Secondary | ICD-10-CM

## 2022-07-09 DIAGNOSIS — T7800XD Anaphylactic reaction due to unspecified food, subsequent encounter: Secondary | ICD-10-CM

## 2022-07-09 DIAGNOSIS — J3089 Other allergic rhinitis: Secondary | ICD-10-CM

## 2022-07-09 MED ORDER — LEVOCETIRIZINE DIHYDROCHLORIDE 5 MG PO TABS
ORAL_TABLET | ORAL | 1 refills | Status: DC
Start: 2022-07-09 — End: 2022-09-08

## 2022-07-09 MED ORDER — TRIAMCINOLONE ACETONIDE 0.1 % EX OINT: 1.0000 | TOPICAL_OINTMENT | Freq: Two times a day (BID) | CUTANEOUS | 2 refills | Status: AC

## 2022-07-09 MED ORDER — EPINEPHRINE 0.3 MG/0.3ML IJ SOAJ: INTRAMUSCULAR | 1 refills | Status: DC

## 2022-07-09 NOTE — Progress Notes (Signed)
Follow Up Note  RE: Adam Pace MRN: [id number] DOB: [birth year] Date of Office Visit: 07/09/2022  Referring provider: Jefm Petty, MD Primary care provider: Jefm Petty, MD  Chief Complaint: Follow-up (Pt states his asthma have been control, but he's been having a lot of flare up with his allergies symptoms of itching and breakouts of hives.) and Asthma  History of Present Illness: I had the pleasure of seeing Adam Pace for a follow up visit at the [facility] [place] on 07/09/2022. He is a 47 y.o. male, who is being followed for rhinitis, fish allergy, reflux and chronic hives . His previous allergy office visit was on 01/08/22 with Dr. Edison Pace. Today is a regular follow up visit.  History obtained from patient, chart review. He reports increased cough, rhinnorhea, nasal congestion due to 2 viral URI/FLU since December.  Needed albuterol 1-2 times each episode at nighttimes.  Denies any ABX or OCS. Treated with tessalon pearls and cough syrup.   On xyzal 44m daily, using flonase sparingly, did use during recent URI's.   If he skips xyzal he will have breakthrough urticaria.  Well controlled if he takes xyzal daily.  He has had increased xerosis of hands associated cracking occasionally.  He does wash his hands multiple times a day.  Washes dishes without gloves.  Not using lotion consistently. Reflux is well-controlled with dietary modifications Continues to avoid crawfish.  No reactions or epipen use since last visit.    Pertinent History/Diagnostics:  - Asthma: Mild intermittent  - normal spirometry (10/22): ratio 87, 3.07L, ,86% FEV1 (pre),- Allergic Rhinitis:   - SPT environmental panel (04/15/21): grass pollens, weed pollens, tree pollens, dog, molds, cockroach, dust mites, and cat  - Food Allergy (crawfish)  - Hx of reaction: pruritus and swelling of the lip tolerates fin fish and all other shellfish   - SPT select foods (12/219): Positive to shrimp, crab,  lobster, and scallop.   - sIgE (2014) reported positive to shellfish  -Urticaria: controlled on levocetirizine  -Cu index, alpha-gal and tryptase: normal (07/22/21)  - triggered by chemicals, perfumes and scratching     Assessment and Plan: Adam Pace a 47y.o. male with: Dyshidrotic eczema  Mild intermittent asthma without complication - Plan: Spirometry with Graph  Anaphylactic shock due to food, subsequent encounter - Plan: EPINEPHrine (AUVI-Q) 0.3 mg/0.3 mL IJ SOAJ injection  Idiopathic urticaria  Seasonal and perennial allergic rhinitis  Gastroesophageal reflux disease without esophagitis   Plan: Patient Instructions  Dyshidrotic eczema -Start triamcinolone 0.1% ointment twice a day until active lesions recover -Use lotion after washing hands -Use gloves when doing dishes -Avoid evaporative drying of hands -Avoid hand sanitizer   Asthma: well controlled  Breathing test today: looked great!  Continue albuterol 2 puffs once every 4 hours as needed for cough or wheeze You may use albuterol 2 puffs 5 to 15 minutes before activity to decrease cough or wheeze  Allergic rhinitis:  Continue allergen avoidance measures directed toward grass pollens, weed pollens, tree pollens, dog, molds, cockroach, dust mites, and cat  Continue levocetirizine 5 mg once a day as needed for runny nose or itch.  -can buy cheaply on amazon  Continue Flonase 1 to 2 sprays in each nostril once a day as needed for stuffy nose.  Consider saline nasal rinses as needed for nasal symptoms. Use this before any medicated nasal sprays for best result  Reflux: well controlled Continue dietary and lifestyle modifications as listed below   Hives-  controlled  Continues levocetirizine 63m daily to prevent hives   Food allergy:  Continue to avoid crawfish and try to identify the one fish that you had a reaction too  -If you are eating foods without symptoms, you can continue to ingest without concerns   -continue carry Auvi-Q and follow anaphylaaxis action plan  If you notice worsening allergy symptoms in regards to her asthma, nose, eczema I would consider restarting allergy injections as this is the only option that can reduce lifetime use of medications.  Follow up: 3 month  Thank you so much for letting me partake in your care today.  Don't hesitate to reach out if you have any additional concerns!  ERoney Marion MD  [facility] [place]      Meds ordered this encounter  Medications   EPINEPHrine (AUVI-Q) 0.3 mg/0.3 mL IJ SOAJ injection    Sig: Use as directed for severe allergic reactions    Dispense:  2 each    Refill:  1   levocetirizine (XYZAL) 5 MG tablet    Sig: TAKE 1 TABLET(5 MG) BY MOUTH DAILY AS NEEDED FOR ALLERGIES    Dispense:  90 tablet    Refill:  1   triamcinolone ointment (KENALOG) 0.1 %    Sig: Apply 1 Application topically 2 (two) times daily.    Dispense:  80 g    Refill:  2    Lab Orders  No laboratory test(s) ordered today   Diagnostics: Spirometry:  Tracings reviewed. His effort: Good reproducible efforts. FVC: 3.48L FEV1: 3.14L, 87% predicted FEV1/FVC ratio: 90% Interpretation: Spirometry consistent with normal pattern.  Please see scanned spirometry results for details.   Results interpreted by myself during this encounter and discussed with patient/family.   Medication List:  Current Outpatient Medications  Medication Sig Dispense Refill   albuterol (VENTOLIN HFA) 108 (90 Base) MCG/ACT inhaler Inhale 2 puffs into the lungs every 4 (four) hours as needed for wheezing or shortness of breath. 18 g 1   EPINEPHrine (AUVI-Q) 0.3 mg/0.3 mL IJ SOAJ injection Use as directed for severe allergic reactions 2 each 1   levocetirizine (XYZAL) 5 MG tablet TAKE 1 TABLET(5 MG) BY MOUTH DAILY AS NEEDED FOR ALLERGIES 90 tablet 1   triamcinolone ointment (KENALOG) 0.1 % Apply 1 Application topically 2 (two) times daily. 80 g 2    Olopatadine HCl (PATADAY) 0.2 % SOLN Place 1 drop into both eyes daily as needed. (Patient not taking: Reported on 07/17/2021) 1 Bottle 5   valsartan (DIOVAN) 160 MG tablet   4   No current facility-administered medications for this visit.   Allergies: Allergies  Allergen Reactions   Shellfish Allergy Anaphylaxis    Mouth swelling   Amoxicillin     Hives    Aspirin    Beef-Derived Products    Nsaids     Hives    Olmesartan Nausea Only    Chest pain   Penicillins    Tolmetin Rash    Hives   I reviewed his past medical history, social history, family history, and environmental history and no significant changes have been reported from his previous visit.  ROS: All others negative except as noted per HPI.   Objective: BP 108/68   Pulse 94   Temp 97.6 F (36.4 C) (Temporal)   Resp 17   Wt 195 lb 14.4 oz (88.9 kg)   SpO2 98%   BMI 29.35 kg/m  Body mass index is 29.35 kg/m. General  Appearance:  Alert, cooperative, no distress, appears stated age  Head:  Normocephalic, without obvious abnormality, atraumatic  Eyes:  Conjunctiva clear, EOM's intact  Nose: Nares normal, normal mucosa, no visible anterior polyps, and septum midline  Throat: Lips, tongue normal; teeth and gums normal, normal posterior oropharynx and no tonsillar exudate  Neck: Supple, symmetrical  Lungs:   clear to auscultation bilaterally, Respirations unlabored, no coughing  Heart:  regular rate and rhythm and no murmur, Appears well perfused  Extremities: No edema  Skin: Skin color, texture, turgor normal, "xerosis of bilateral hands, eczematous patches on sides of fingers and intertrigenous areas   Neurologic: No gross deficits   Previous notes and tests were reviewed. The plan was reviewed with the patient/family, and all questions/concerned were addressed.  It was my pleasure to see Adam Pace today and participate in his care. Please feel free to contact me with any questions or  concerns.  Sincerely,  Adam Marion, MD  Allergy & Immunology  [facility] [place] [facility] Office: [phone]

## 2022-07-09 NOTE — Patient Instructions (Addendum)
Dyshidrotic eczema -Start triamcinolone 0.1% ointment twice a day until active lesions recover -Use lotion after washing hands -Use gloves when doing dishes -Avoid evaporative drying of hands -Avoid hand sanitizer   Asthma: well controlled  Breathing test today: looked great!  Continue albuterol 2 puffs once every 4 hours as needed for cough or wheeze You may use albuterol 2 puffs 5 to 15 minutes before activity to decrease cough or wheeze  Allergic rhinitis:  Continue allergen avoidance measures directed toward grass pollens, weed pollens, tree pollens, dog, molds, cockroach, dust mites, and cat  Continue levocetirizine 5 mg once a day as needed for runny nose or itch.  -can buy cheaply on amazon  Continue Flonase 1 to 2 sprays in each nostril once a day as needed for stuffy nose.  Consider saline nasal rinses as needed for nasal symptoms. Use this before any medicated nasal sprays for best result  Reflux: well controlled Continue dietary and lifestyle modifications as listed below   Hives- controlled  Continues levocetirizine 8m daily to prevent hives   Food allergy:  Continue to avoid crawfish and try to identify the one fish that you had a reaction too  -If you are eating foods without symptoms, you can continue to ingest without concerns  -continue carry Auvi-Q and follow anaphylaaxis action plan  If you notice worsening allergy symptoms in regards to her asthma, nose, eczema I would consider restarting allergy injections as this is the only option that can reduce lifetime use of medications.  Follow up: 3 month  Thank you so much for letting me partake in your care today.  Don't hesitate to reach out if you have any additional concerns!  ERoney Marion MD  [facility] [place]

## 2022-09-08 ENCOUNTER — Other Ambulatory Visit: Payer: Self-pay | Admitting: Internal Medicine

## 2022-10-07 ENCOUNTER — Ambulatory Visit (INDEPENDENT_AMBULATORY_CARE_PROVIDER_SITE_OTHER): Payer: BC Managed Care – PPO | Admitting: Internal Medicine

## 2022-10-07 VITALS — BP 110/74 | HR 78 | Temp 97.9°F | Resp 16

## 2022-10-07 DIAGNOSIS — J4520 Mild intermittent asthma, uncomplicated: Secondary | ICD-10-CM

## 2022-10-07 DIAGNOSIS — T7800XA Anaphylactic reaction due to unspecified food, initial encounter: Secondary | ICD-10-CM

## 2022-10-07 DIAGNOSIS — J3089 Other allergic rhinitis: Secondary | ICD-10-CM

## 2022-10-07 DIAGNOSIS — J302 Other seasonal allergic rhinitis: Secondary | ICD-10-CM | POA: Diagnosis not present

## 2022-10-07 DIAGNOSIS — L301 Dyshidrosis [pompholyx]: Secondary | ICD-10-CM | POA: Diagnosis not present

## 2022-10-07 DIAGNOSIS — K219 Gastro-esophageal reflux disease without esophagitis: Secondary | ICD-10-CM

## 2022-10-07 NOTE — Patient Instructions (Addendum)
Dyshidrotic eczema: not controlled  -Continue triamcinolone 0.1% ointment twice a day until active lesions recover -Use lotion after washing hands -Use gloves when doing dishes -Avoid evaporative drying of hands -Avoid hand sanitizer   Asthma: well controlled  Breathing test looked good  Continue albuterol 2 puffs once every 4 hours as needed for cough or wheeze You may use albuterol 2 puffs 5 to 15 minutes before activity to decrease cough or wheeze  Allergic rhinitis: controlled  Continue allergen avoidance measures directed toward grass pollens, weed pollens, tree pollens, dog, molds, cockroach, dust mites, and cat  Continue levocetirizine 5 mg once a day as needed for runny nose or itch. Continue Flonase 1 to 2 sprays in each nostril once a day as needed for stuffy nose.  Consider saline nasal rinses as needed for nasal symptoms. Use this before any medicated nasal sprays for best result  Reflux: well controlled Continue dietary and lifestyle modifications as listed below   Hives-  not controlled  Continues levocetirizine 5mg  1-2 times per day for hives We will email Tammy our biologic coordinator about getting xolair approved for chronic hives Continue benadryl 25mg  as needed for breakthrough hives, itch   Food allergy:  Continue to avoid crawfish and try to identify the one fish that you had a reaction too  -If you are eating foods without symptoms, you can continue to ingest without concerns  -continue carry Auvi-Q and follow anaphylaaxis action plan   Follow up: 3 month  Thank you so much for letting me partake in your care today.  Don't hesitate to reach out if you have any additional concerns!  Ferol Luz, MD  [facility] [place], [place]

## 2022-10-07 NOTE — Progress Notes (Signed)
Follow Up Note  RE: Adam Pace MRN: [id number] DOB: 22-May-1976 Date of Office Visit: 10/07/2022  Referring provider: Loyal Jacobson, MD Primary care provider: Loyal Jacobson, MD  Chief Complaint: Eczema (Hands x 6 months using Triamcinolone ) and Allergies (Taking Xyzal daily to control itching )  History of Present Illness: I had the pleasure of seeing Adam Pace for a follow up visit [facility] [place] on 10/07/2022. He is a 47 y.o. male, who is being followed for rhinitis, fish allergy, reflux and chronic hives . His previous allergy office visit was on 07/09/22 with Dr. Marlynn Perking. Today is a regular follow up visit.  History obtained from patient, chart review. Asthma is well-controlled.  Denies any albuterol use since last visit.  Not having any cough, wheeze, dyspnea. ACT 24 (4/5/5/5/5)  Denies any ABX or OCS.   On xyzal 5mg  daily, using flonase sparingly,    Nasal and ocular symptoms are well controlled If he skips xyzal he will have breakthrough urticaria.  Some breakthrough itch with daily xzayl.  Currently taking 1-2 tabs daily and needing benadryl for breakthrough symptoms about monthly   Continues to have cracking, dry peeling skin on fingers.  Still washing hands multiple times a day and washing dishes nightly.  Has not used triamcinolone regularly.  Not using emollients regularly.  Reflux is well-controlled with dietary modifications Continues to avoid crawfish.  No reactions or epipen use since last visit.    Pertinent History/Diagnostics:  - Asthma: Mild intermittent  - normal spirometry (10/22): ratio 87, 3.07L, ,86% FEV1 (pre),- Allergic Rhinitis:   - SPT environmental panel (04/15/21): grass pollens, weed pollens, tree pollens, dog, molds, cockroach, dust mites, and cat  - Food Allergy (crawfish)  - Hx of reaction: pruritus and swelling of the lip tolerates fin fish and all other shellfish   - SPT select foods (12/219): Positive to shrimp,  crab, lobster, and scallop.   - sIgE (2014) reported positive to shellfish  -Urticaria: controlled on levocetirizine  -Cu index, alpha-gal and tryptase: normal (07/22/21)  - triggered by chemicals, perfumes and scratching  - Dyshidrotic eczema: due to handwashing   - TAC 0.1% ointment started 07/09/22   Assessment and Plan: Jymere is a 47 y.o. male with: Mild intermittent asthma without complication - Plan: Spirometry with Graph  Dyshidrotic eczema  Seasonal and perennial allergic rhinitis  Allergy with anaphylaxis due to food  Gastroesophageal reflux disease without esophagitis   Plan: Patient Instructions  Dyshidrotic eczema: not controlled  -Continue triamcinolone 0.1% ointment twice a day until active lesions recover -Use lotion after washing hands -Use gloves when doing dishes -Avoid evaporative drying of hands -Avoid hand sanitizer   Asthma: well controlled  Breathing test looked good  Continue albuterol 2 puffs once every 4 hours as needed for cough or wheeze You may use albuterol 2 puffs 5 to 15 minutes before activity to decrease cough or wheeze  Allergic rhinitis: controlled  Continue allergen avoidance measures directed toward grass pollens, weed pollens, tree pollens, dog, molds, cockroach, dust mites, and cat  Continue levocetirizine 5 mg once a day as needed for runny nose or itch. Continue Flonase 1 to 2 sprays in each nostril once a day as needed for stuffy nose.  Consider saline nasal rinses as needed for nasal symptoms. Use this before any medicated nasal sprays for best result  Reflux: well controlled Continue dietary and lifestyle modifications as listed below   Hives-  not controlled  Continues levocetirizine  5mg  1-2 times per day for hives We will email Tammy our biologic coordinator about getting xolair approved for chronic hives Continue benadryl 25mg  as needed for breakthrough hives, itch   Food allergy:  Continue to avoid crawfish and try  to identify the one fish that you had a reaction too  -If you are eating foods without symptoms, you can continue to ingest without concerns  -continue carry Auvi-Q and follow anaphylaaxis action plan   Follow up: 3 month  Thank you so much for letting me partake in your care today.  Don't hesitate to reach out if you have any additional concerns!  Ferol Luz, MD  [facility] [place], [place]     No orders of the defined types were placed in this encounter.   Lab Orders  No laboratory test(s) ordered today   Diagnostics: Spirometry:  Tracings reviewed. His effort: Good reproducible efforts. FVC: 3.26L FEV1: 3.02L, 84% predicted FEV1/FVC ratio: 93% Interpretation: Spirometry consistent with normal pattern.  Please see scanned spirometry results for details.   Results interpreted by myself during this encounter and discussed with patient/family.   Medication List:  Current Outpatient Medications  Medication Sig Dispense Refill   albuterol (VENTOLIN HFA) 108 (90 Base) MCG/ACT inhaler Inhale 2 puffs into the lungs every 4 (four) hours as needed for wheezing or shortness of breath. 18 g 1   EPINEPHrine (AUVI-Q) 0.3 mg/0.3 mL IJ SOAJ injection Use as directed for severe allergic reactions 2 each 1   levocetirizine (XYZAL) 5 MG tablet TAKE 1 TABLET(5 MG) BY MOUTH DAILY AS NEEDED FOR ALLERGIES 90 tablet 1   triamcinolone ointment (KENALOG) 0.1 % Apply 1 Application topically 2 (two) times daily. 80 g 2   valsartan (DIOVAN) 160 MG tablet   4   No current facility-administered medications for this visit.   Allergies: Allergies  Allergen Reactions   Shellfish Allergy Anaphylaxis    Mouth swelling   Amoxicillin     Hives    Aspirin    Beef-Derived Products    Nsaids     Hives    Olmesartan Nausea Only    Chest pain   Penicillins    Tolmetin Rash    Hives   I reviewed his past medical history, social history, family history, and environmental  history and no significant changes have been reported from his previous visit.  ROS: All others negative except as noted per HPI.   Objective: BP 110/74   Pulse 78   Temp 97.9 F (36.6 C) (Temporal)   Resp 16   SpO2 97%  There is no height or weight on file to calculate BMI. General Appearance:  Alert, cooperative, no distress, appears stated age  Head:  Normocephalic, without obvious abnormality, atraumatic  Eyes:  Conjunctiva clear, EOM's intact  Nose: Nares normal, normal mucosa, no visible anterior polyps, and septum midline  Throat: Lips, tongue normal; teeth and gums normal, normal posterior oropharynx and no tonsillar exudate  Neck: Supple, symmetrical  Lungs:   clear to auscultation bilaterally, Respirations unlabored, no coughing  Heart:  regular rate and rhythm and no murmur, Appears well perfused  Extremities: No edema  Skin: Skin color, texture, turgor normal,  xerosis, cracking, eczematous patches on bilateral first and second digits; erythematous papules behind neck  Neurologic: No gross deficits   Previous notes and tests were reviewed. The plan was reviewed with the patient/family, and all questions/concerned were addressed.  It was my pleasure to see Adam Pace today and participate  in his care. Please feel free to contact me with any questions or concerns.  Sincerely,  Ferol Luz, MD  Allergy & Immunology  [facility] Office: [phone]

## 2022-10-09 ENCOUNTER — Telehealth: Payer: Self-pay | Admitting: *Deleted

## 2022-10-09 NOTE — Telephone Encounter (Signed)
-----   Message from Ferol Luz, MD sent at 10/07/2022  9:07 AM EDT ----- Can we see about getting Xolair 300 mg every 4 weeks approved for chronic idiopathic urticaria?  Thank you!

## 2022-10-09 NOTE — Telephone Encounter (Signed)
Called patient and advised approval, copay card and submit for Xolair to [employer]. Will reach out to patient once delivery set to make appt to start therapy

## 2022-10-22 ENCOUNTER — Ambulatory Visit: Payer: BC Managed Care – PPO

## 2022-10-24 ENCOUNTER — Ambulatory Visit (INDEPENDENT_AMBULATORY_CARE_PROVIDER_SITE_OTHER): Payer: BC Managed Care – PPO

## 2022-10-24 DIAGNOSIS — L501 Idiopathic urticaria: Secondary | ICD-10-CM | POA: Diagnosis not present

## 2022-10-24 MED ORDER — OMALIZUMAB 150 MG/ML ~~LOC~~ SOSY
300.0000 mg | PREFILLED_SYRINGE | SUBCUTANEOUS | Status: AC
  Administered 2022-10-24 – 2023-03-10 (×6): 300 mg via SUBCUTANEOUS

## 2022-10-24 NOTE — Progress Notes (Signed)
Immunotherapy   Patient Details  Name: Adam Pace MRN: [id number] Date of Birth: 30-Oct-1975  10/24/2022  Adam Pace started injections for Xolair 300 mg  Frequency: Q 4 weeks Epi-Pen:Epi-Pen Available  Consent signed and patient instructions given.   Adam Pace Adam Pace 10/24/2022, 8:31 AM

## 2022-11-17 IMAGING — CT CT ABD-PELV W/ CM
2 of 5 series · 15 of 46 positions shown, 17 images · IV contrast (agent unspecified)
Comparison: CT without contrast 04/07/2010.

CLINICAL DATA: Epigastric pain sudden onset.

EXAM:
CT ABDOMEN AND PELVIS WITH CONTRAST
TECHNIQUE: Multidetector CT imaging of the abdomen and pelvis was performed
using the standard protocol following bolus administration of
intravenous contrast.

[Series 2: axial st · axial · 0.82mm/px · z∈[-500,-50]mm · 12 of 102 slices shown, 14 images]
[im 6/102  soft-tissue]
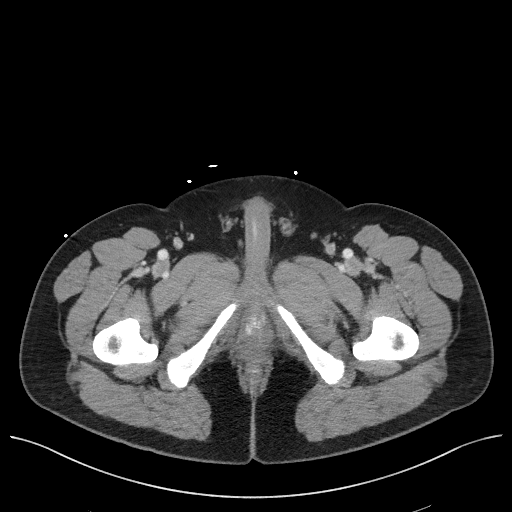
[im 6/102  bone]
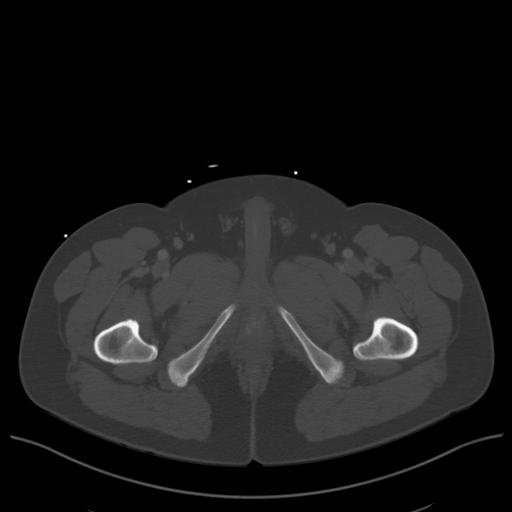
[im 16/102  soft-tissue]
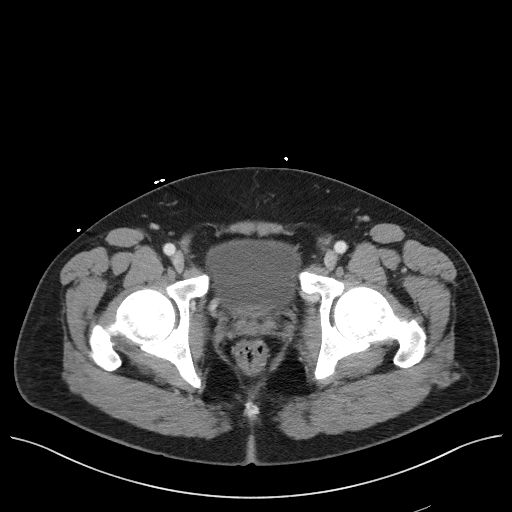
[im 22/102  soft-tissue]
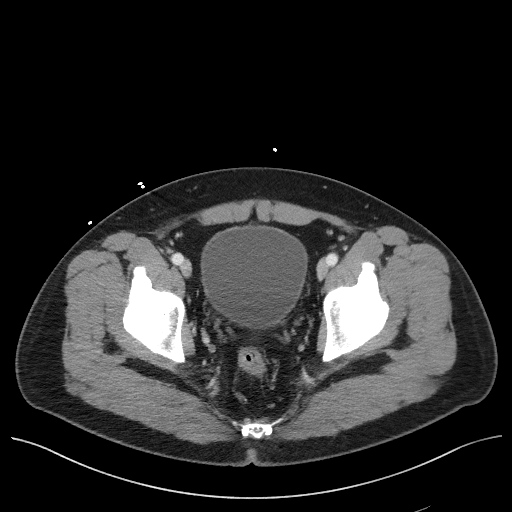
[im 32/102  soft-tissue]
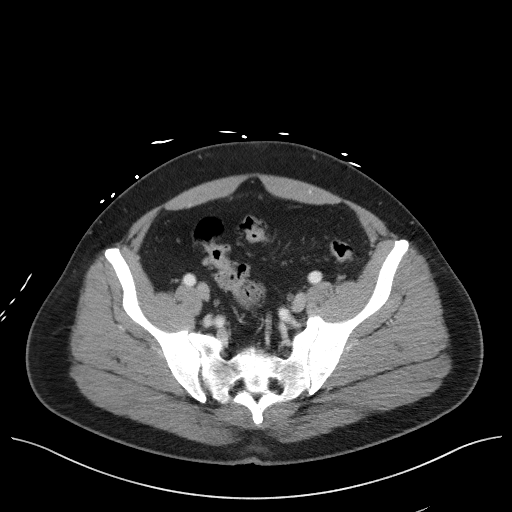
[im 38/102  soft-tissue]
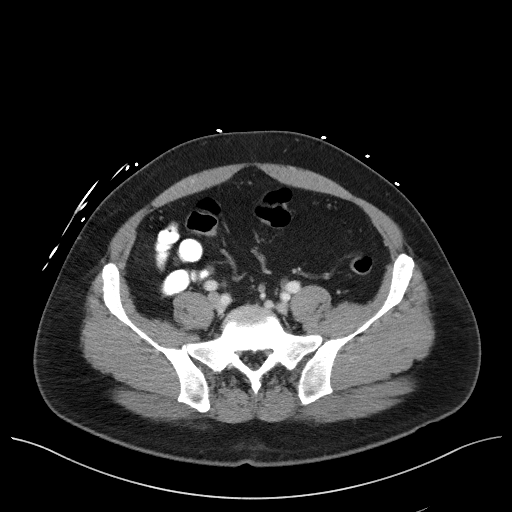
[im 48/102  soft-tissue]
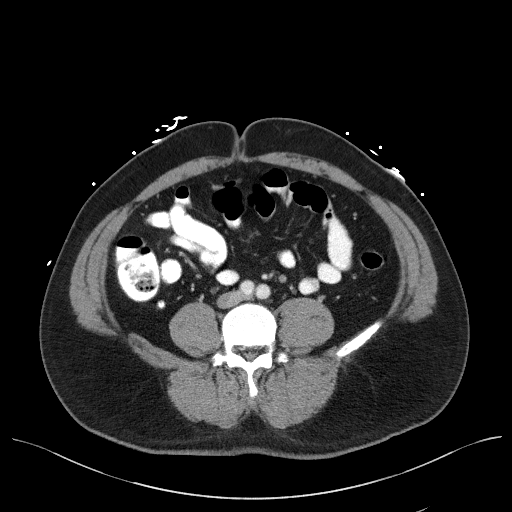
[im 54/102  soft-tissue]
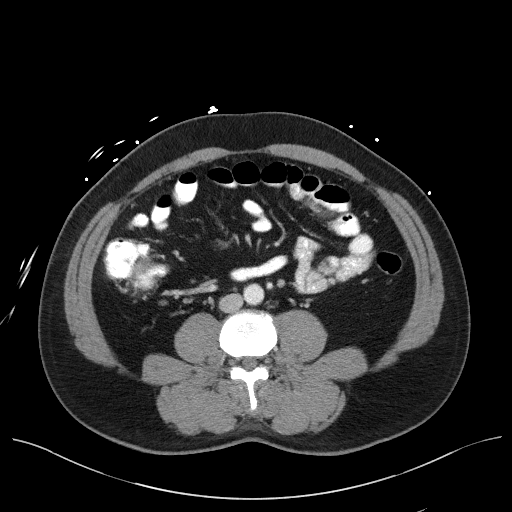
[im 64/102  soft-tissue]
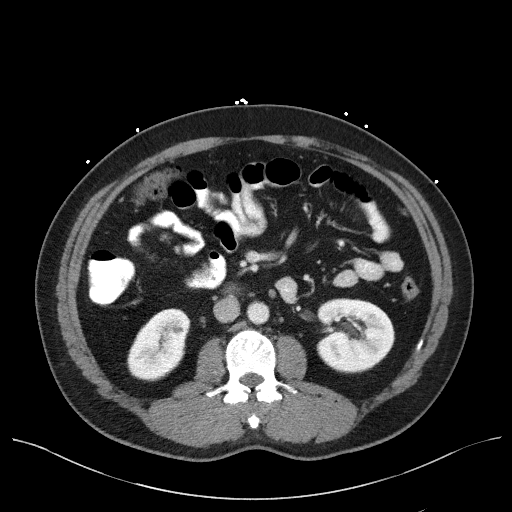
[im 70/102  soft-tissue]
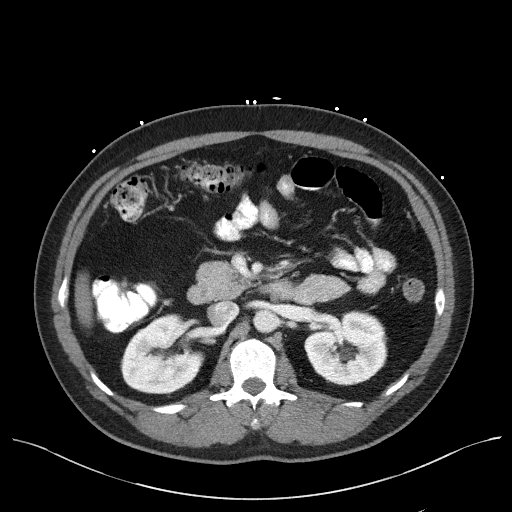
[im 70/102  bone]
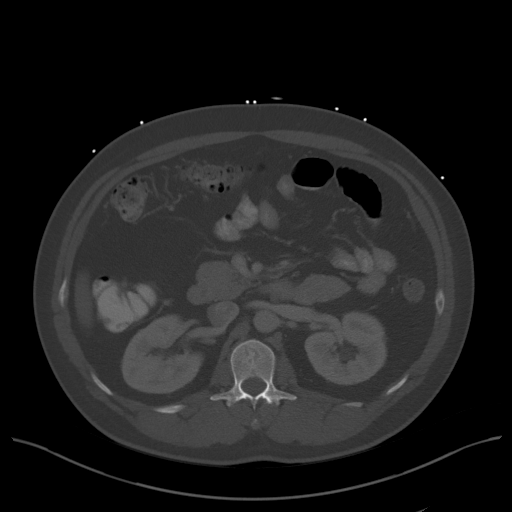
[im 80/102  soft-tissue]
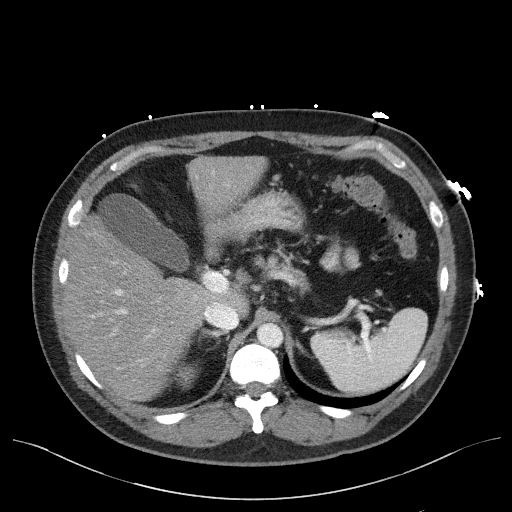
[im 86/102  soft-tissue]
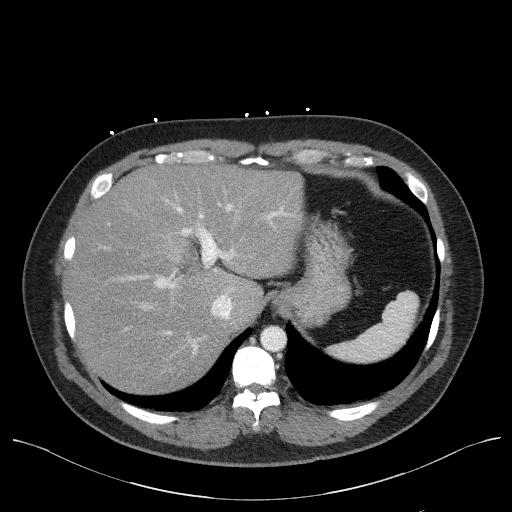
[im 96/102  soft-tissue]
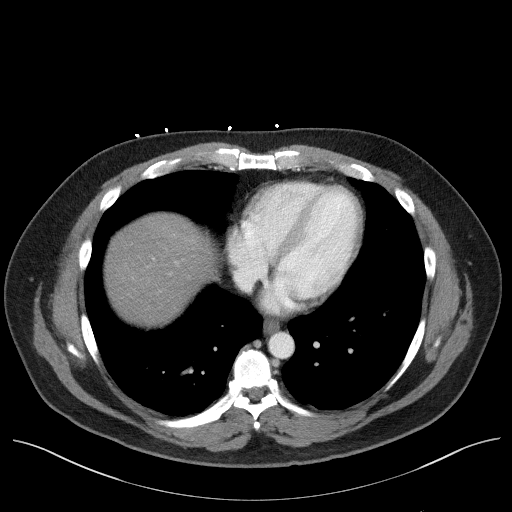

[Series 5: coronal st · coronal · 0.78mm/px · 3 of 111 slices shown]
[im 37/111  soft-tissue]
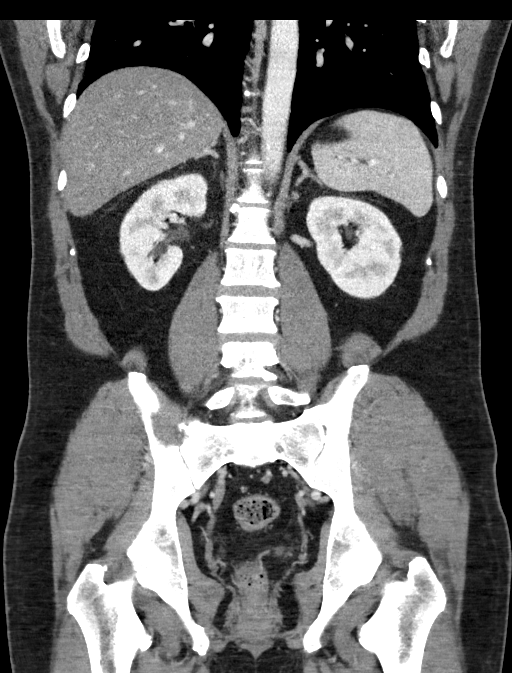
[im 49/111  soft-tissue]
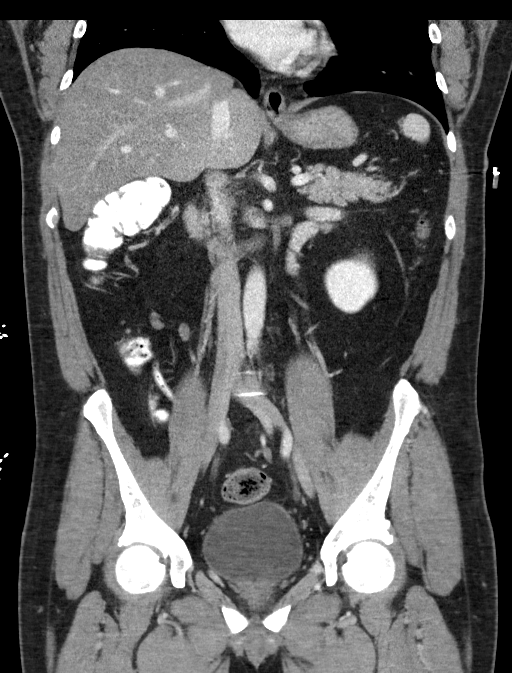
[im 62/111  soft-tissue]
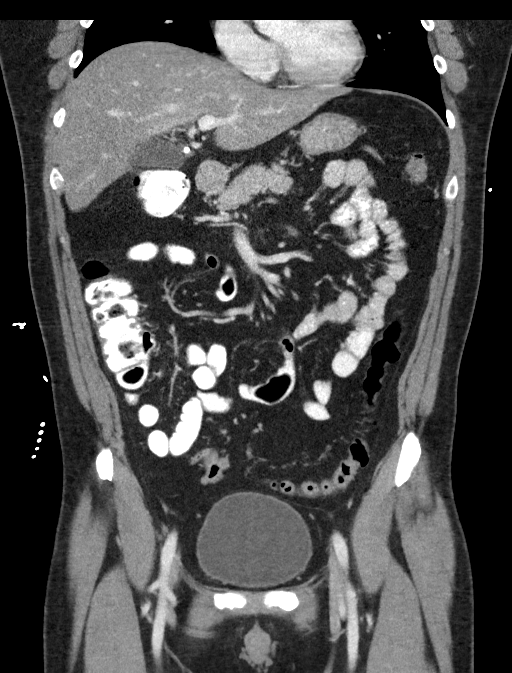

[15 of 46 positions shown; findings below may reference images not displayed]

RADIATION DOSE REDUCTION: This exam was performed according to the
departmental dose-optimization program which includes automated
exposure control, adjustment of the mA and/or kV according to
patient size and/or use of iterative reconstruction technique.

CONTRAST:  100mL OMNIPAQUE IOHEXOL 350 MG/ML SOLN
FINDINGS: Lower chest: Acute abnormality is seen. Mild elevation right
hemidiaphragm.

Hepatobiliary: 16.2 cm in length moderately steatotic liver without
mass. The gallbladder and bile ducts unremarkable, but there is a
4.5 mm stone believed lodged in the cystic duct not seen previously
with the gallbladder upper-normal in distention.

Pancreas: Unremarkable.

Spleen: Unremarkable.

Adrenals/Urinary Tract: There is no adrenal mass. There are small
hypodensities scattered within the bilateral kidneys which too small
to characterize and may or may not have been present on the prior
noncontrast exam. There is no urinary stone or obstruction. There is
a normal bladder thickness.

Stomach/Bowel: No dilatation or wall thickening including the
appendix. Diverticula scattered throughout the transverse and left
colon without evidence of diverticulitis.

Vascular/Lymphatic: No significant vascular findings are present. No
enlarged abdominal or pelvic lymph nodes.

Reproductive: No prostatomegaly.

Other: There is no free air, hemorrhage or fluid. There is no
incarcerated hernia.

Musculoskeletal: No acute or significant osseous findings. Bridging
osteophytes left SI joint. Mild lumbar spondylosis.
IMPRESSION: 1. There is a 4.5 mm calculus inseparable from the plane of the
cystic duct and is believed to be lodged within it. The gallbladder
wall is not grossly thickened and no calcified stones are seen in
the gallbladder. There is no biliary dilatation.
2. Fatty liver.
3. Small renal hypodensities which are too small characterize but
likely cysts.
4. Diverticulosis without diverticulitis.

## 2022-11-17 IMAGING — US US ABDOMEN LIMITED
1 series · 14 of 25 positions shown · non-contrast
Comparison: Abdominal CT from earlier the same day

CLINICAL DATA: Abnormal CT

EXAM:
ULTRASOUND ABDOMEN LIMITED RIGHT UPPER QUADRANT

[Series 1: us abdomen limited · 14 of 80 slices shown]
[im 1/80]
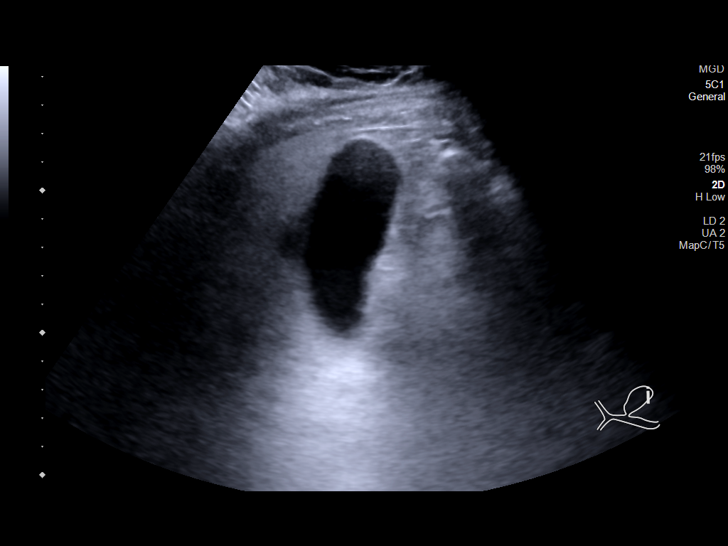
[im 7/80]
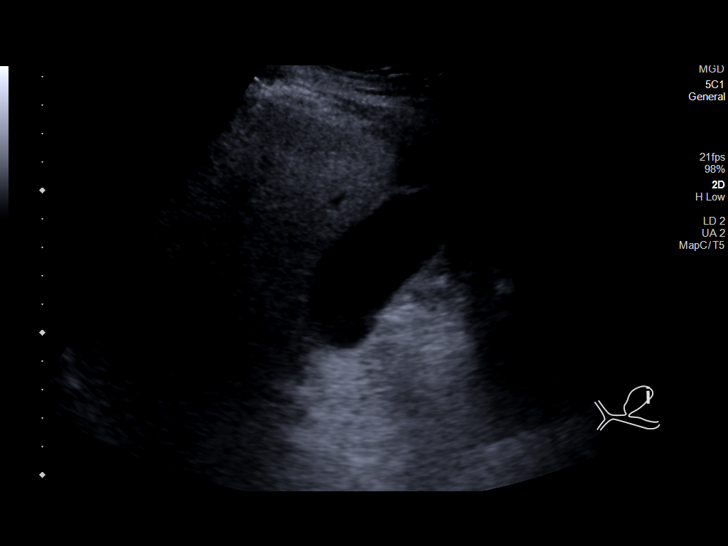
[im 14/80]
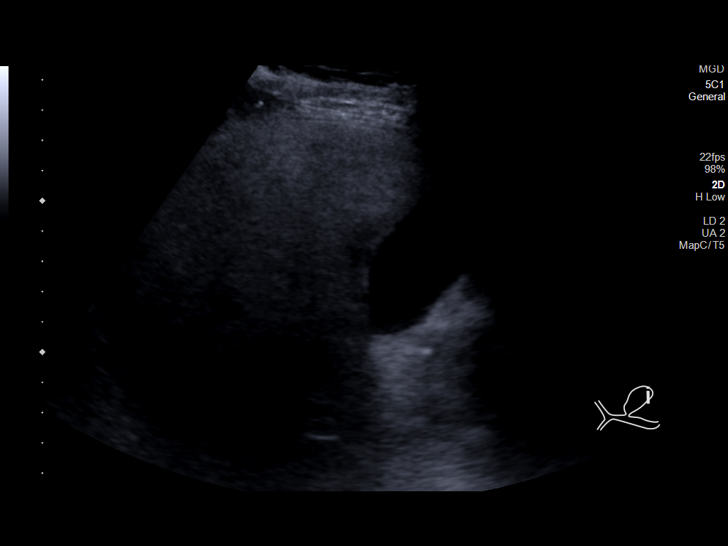
[im 20/80]
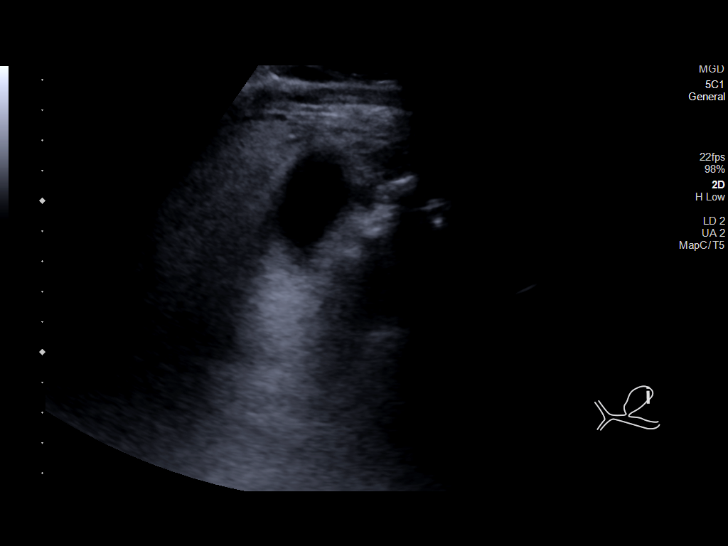
[im 27/80]
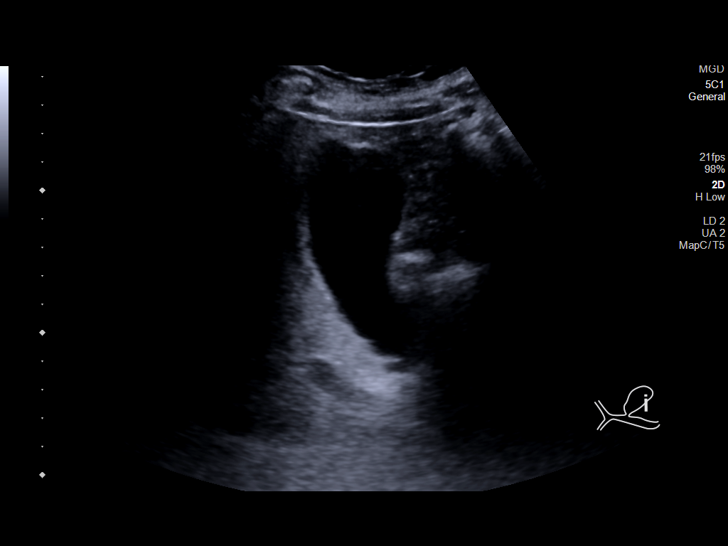
[im 30/80]
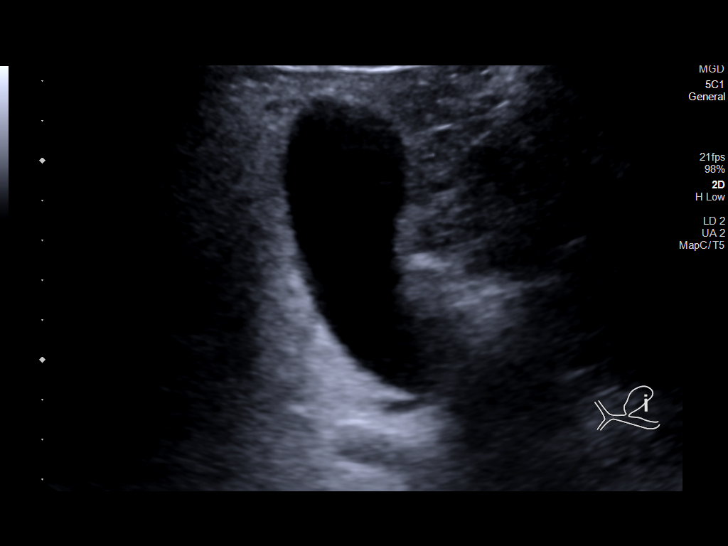
[im 37/80]
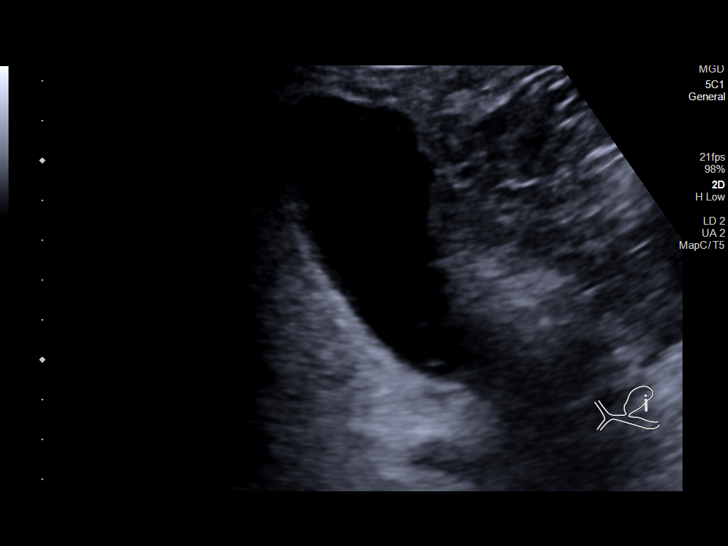
[im 43/80]
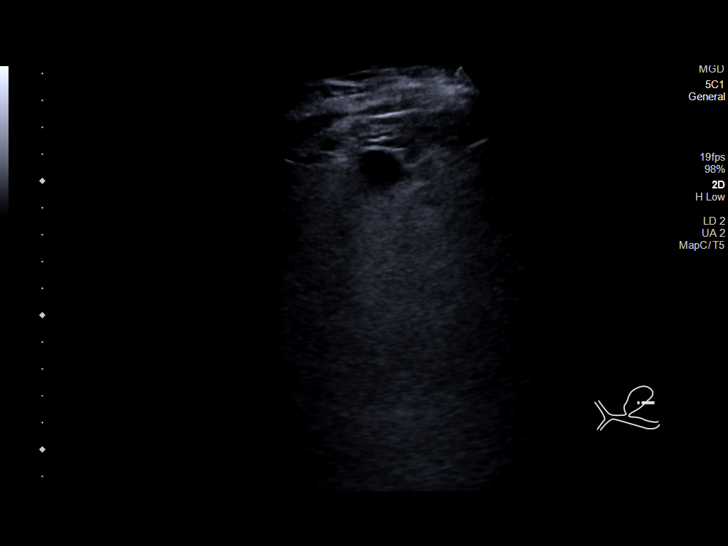
[im 50/80]
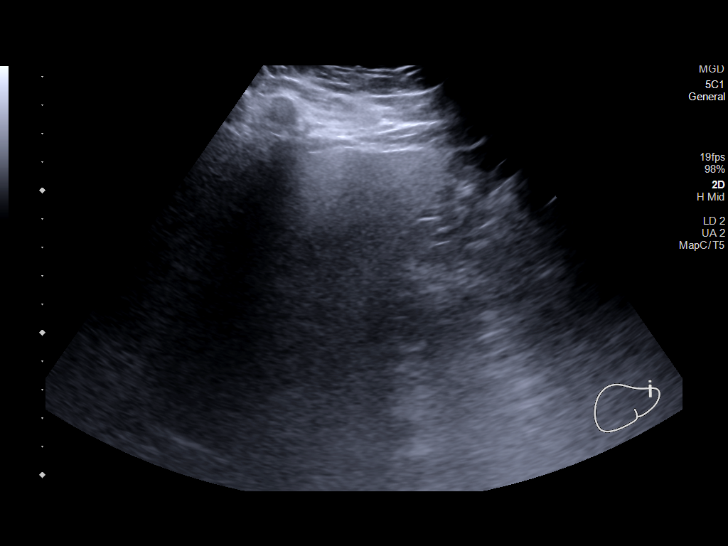
[im 53/80]
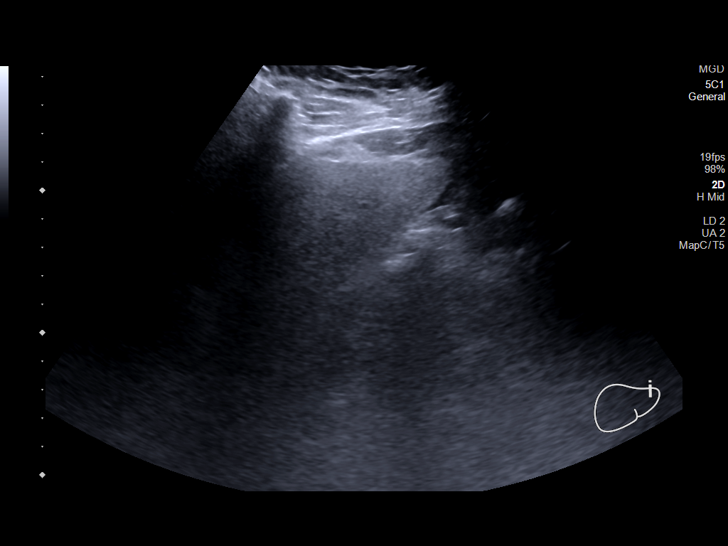
[im 60/80]
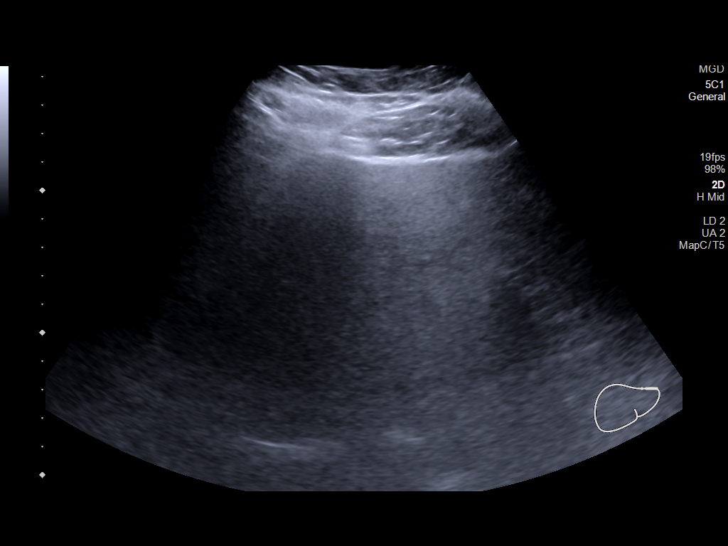
[im 66/80]
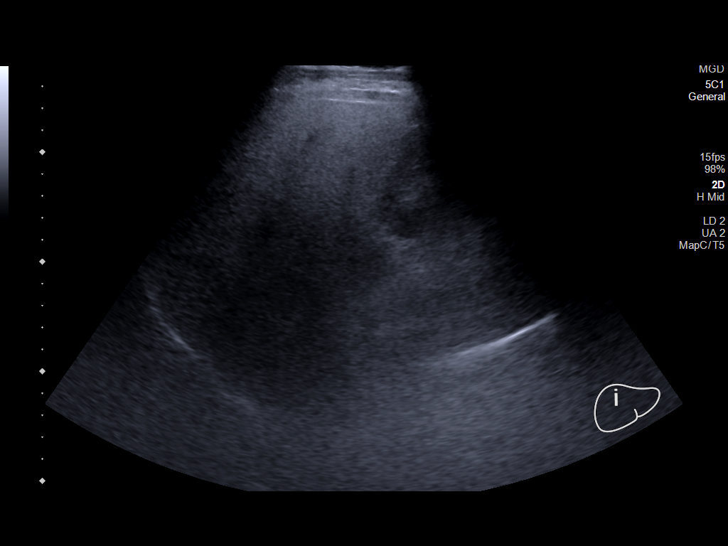
[im 73/80]
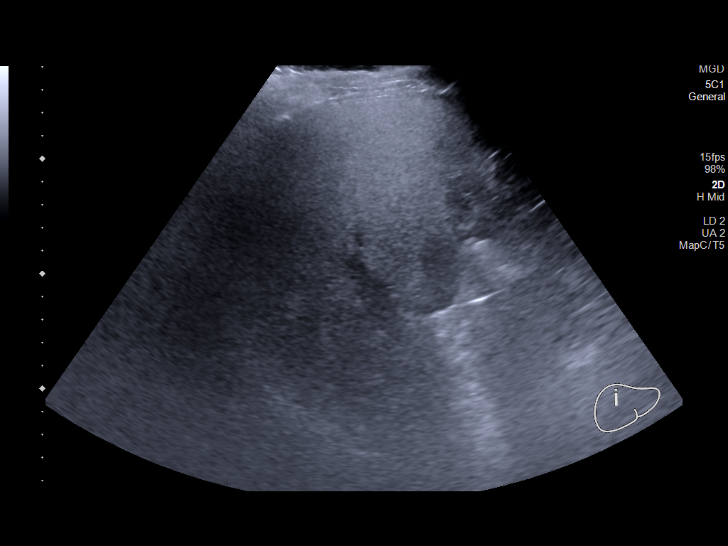
[im 80/80]
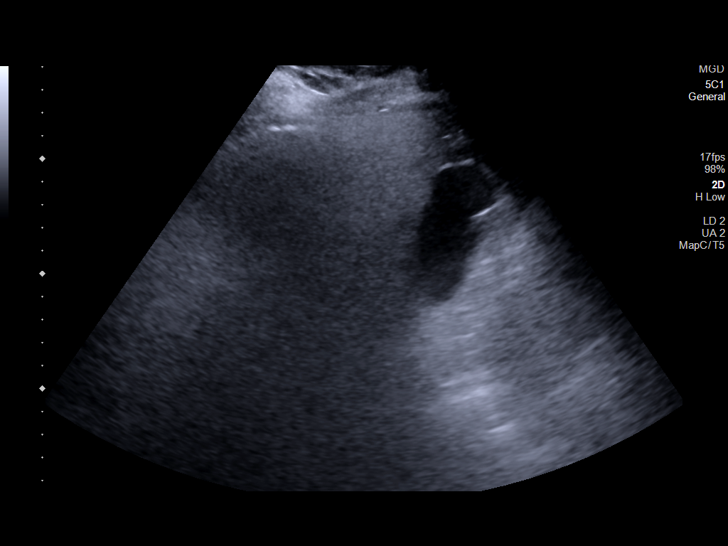

[14 of 25 positions shown; findings below may reference images not displayed]

FINDINGS: Gallbladder:

No visible stone, although calcified stone at the neck/cystic duct
was convincing on ultrasound. No wall thickening or focal
tenderness.

Common bile duct:

Diameter: 4 mm.  Limited coverage.

Liver:

Echogenic liver. No focal abnormality. Portal vein is patent on
color Doppler imaging with normal direction of blood flow towards
the liver.
IMPRESSION: 1. Negative gallbladder by ultrasound, although convincing stone at
the gallbladder neck/cystic duct by prior abdominal CT.
2. Hepatic steatosis.

## 2022-11-21 ENCOUNTER — Ambulatory Visit (INDEPENDENT_AMBULATORY_CARE_PROVIDER_SITE_OTHER): Payer: BC Managed Care – PPO

## 2022-11-21 DIAGNOSIS — L501 Idiopathic urticaria: Secondary | ICD-10-CM | POA: Diagnosis not present

## 2022-12-19 ENCOUNTER — Ambulatory Visit: Payer: BC Managed Care – PPO

## 2022-12-19 DIAGNOSIS — L501 Idiopathic urticaria: Secondary | ICD-10-CM

## 2023-01-13 ENCOUNTER — Other Ambulatory Visit: Payer: Self-pay

## 2023-01-13 ENCOUNTER — Ambulatory Visit: Payer: BC Managed Care – PPO | Admitting: Family Medicine

## 2023-01-13 ENCOUNTER — Encounter: Payer: Self-pay | Admitting: Family Medicine

## 2023-01-13 VITALS — BP 122/76 | HR 89 | Temp 97.9°F | Resp 20 | Wt 191.5 lb

## 2023-01-13 DIAGNOSIS — L501 Idiopathic urticaria: Secondary | ICD-10-CM

## 2023-01-13 DIAGNOSIS — K219 Gastro-esophageal reflux disease without esophagitis: Secondary | ICD-10-CM | POA: Diagnosis not present

## 2023-01-13 DIAGNOSIS — J3089 Other allergic rhinitis: Secondary | ICD-10-CM

## 2023-01-13 DIAGNOSIS — J4520 Mild intermittent asthma, uncomplicated: Secondary | ICD-10-CM

## 2023-01-13 DIAGNOSIS — T7800XD Anaphylactic reaction due to unspecified food, subsequent encounter: Secondary | ICD-10-CM

## 2023-01-13 MED ORDER — EPINEPHRINE 0.3 MG/0.3ML IJ SOAJ: INTRAMUSCULAR | 1 refills | Status: AC

## 2023-01-13 NOTE — Progress Notes (Signed)
[address] [place] [address] Dept: [phone]  FOLLOW UP NOTE  Patient ID: Adam Pace, male    DOB: 06-13-1975  Age: 47 y.o. MRN: [id number] Date of Office Visit: 01/13/2023  Assessment  Chief Complaint: Follow-up, Eczema, and Medication Refill (F/u xolair working well, refill epi)  HPI Adam Pace is a 47 year old male who presents to the clinic for follow-up visit.  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  At today's visit, he reports his asthma has been moderately well-controlled with occasional wheeze occurring at nighttime and cough producing thick clear mucus in the morning over the last several days.  He reports that he has used albuterol 4 times over the last 3 months with relief of symptoms.  Allergic rhinitis is reported as well-controlled with no symptoms including rhinorrhea, nasal congestion, sneezing, or postnasal drainage.  He continues levocetirizine on most weekdays and is not currently using Flonase or nasal saline rinses.His last environmental allergy skin testing was positive to grass pollen, weed pollen, tree pollen, dog, mold, cockroach, and dust mites and borderline positive to cat and 04/15/2021.  Reflux is reported as moderately well-controlled with occasional heartburn for which he takes an over-the-counter antacid with relief of symptoms.  He reports that this had been an issue for him a few weeks ago, however, he is not experiencing any reflux symptoms at this time or taking any antacids at this time.  He denies any breakouts since his last visit to this clinic.  He continues levocetirizine most days and continues to receive Xolair 300 mg once every 4 weeks.  He denies any large or local reactions after Xolair injections.  He reports a significant decrease in urticaria recurrence while continuing Xolair.  He reports that he  continues to avoid crawfish and a fish that he had previously that caused anaphylactic symptoms.  He reports that he cannot recall the name of the fish may cause the symptoms.  He does continue to consume lobster, shrimp, tuna, salmon, and tilapia with no adverse reaction.  Auvi-Q epinephrine autoinjector device is up-to-date.  His current medications are listed in the chart.  Allergies  Allergen Reactions   Shellfish Allergy Anaphylaxis    Mouth swelling   Amoxicillin     Hives    Aspirin    Beef-Derived Products    Nsaids     Hives    Olmesartan Nausea Only    Chest pain   Penicillins    Tolmetin Rash    Hives    Physical Exam: BP 122/76 (BP Location: Right Arm, Patient Position: Sitting, Cuff Size: Normal)   Pulse 89   Temp 97.9 F (36.6 C) (Temporal)   Resp 20   Wt 191 lb 8 oz (86.9 kg)   SpO2 97%   BMI 28.69 kg/m    Physical Exam Vitals reviewed.  Constitutional:      Appearance: Normal appearance.  HENT:     Head: Normocephalic and atraumatic.     Right Ear: Tympanic membrane normal.     Left Ear: Tympanic membrane normal.     Nose:     Comments: Bilateral nares slightly erythematous with thin clear nasal drainage noted.  Pharynx normal.  Ears normal.  Eyes normal.    Mouth/Throat:     Pharynx: Oropharynx is clear.  Eyes:     Conjunctiva/sclera: Conjunctivae normal.  Cardiovascular:  Rate and Rhythm: Normal rate and regular rhythm.     Heart sounds: Normal heart sounds. No murmur heard. Pulmonary:     Effort: Pulmonary effort is normal.     Breath sounds: Normal breath sounds.     Comments: Lungs clear to auscultation Musculoskeletal:        General: Normal range of motion.     Cervical back: Normal range of motion and neck supple.  Skin:    General: Skin is warm and dry.  Neurological:     Mental Status: He is alert and oriented to person, place, and time.  Psychiatric:        Mood and Affect: Mood normal.        Behavior: Behavior normal.         Thought Content: Thought content normal.        Judgment: Judgment normal.    Assessment and Plan: 1. Mild intermittent asthma without complication   2. Seasonal and perennial allergic rhinitis   3. Idiopathic urticaria   4. Gastroesophageal reflux disease without esophagitis   5. Anaphylactic shock due to food, subsequent encounter     Meds ordered this encounter  Medications   EPINEPHrine (AUVI-Q) 0.3 mg/0.3 mL IJ SOAJ injection    Sig: Use as directed for severe allergic reactions    Dispense:  2 each    Refill:  1    Patient Instructions  Asthma Moderately well-controlled Continue albuterol 2 puffs once every 4 hours as needed for cough or wheeze You may use albuterol 2 puffs 5 to 15 minutes before activity to decrease cough or wheeze  Allergic rhinitis Well-controlled Continue allergen avoidance measures directed toward grass pollens, weed pollens, tree pollens, dog, molds, cockroach, dust mites, and cat  Continue levocetirizine 5 mg once a day as needed for runny nose or itch. Remember to rotate to a different antihistamine about every 3 months. Some examples of over the counter antihistamines include Zyrtec (cetirizine), Xyzal (levocetirizine), Allegra (fexofenadine), and Claritin (loratidine).  Consider Flonase 1 to 2 sprays in each nostril once a day as needed for stuffy nose. In the right nostril, point the applicator out toward the right ear. In the left nostril, point the applicator out toward the left ear Consider saline nasal rinses as needed for nasal symptoms. Use this before any medicated nasal sprays for best result  Reflux Moderately well-controlled Continue dietary and lifestyle modifications as listed below If you begin to experience heartburn, begin famotidine 20 mg twice a day to control reflux.   Hives Stable Use the least amount of medications while remaining hive free Levocetirizine (Xyzal) 5 mg twice a day and famotidine (Pepcid) 20 mg twice a  day. If no symptoms for 7-14 days then decrease to. Levocetirizine (Xyzal) 5 mg twice a day and famotidine (Pepcid) 20 mg once a day.  If no symptoms for 7-14 days then decrease to. Levocetirizine (Xyzal) 5 mg twice a day.  If no symptoms for 7-14 days then decrease to. Levocetirizine (Xyzal) 5 mg once a day.  Continue Xolair injections 300 mg once every 28 days and have access to an epinephrine auto-injector set per protocol  Food allergy Well-controlled Continue to avoid crawfish and the one other fish that caused a reaction. In case of an allergic reaction, take Benadryl 50 mg every 4 hours, and if life-threatening symptoms occur, inject with AuviQ 0.3 mg.  Call the clinic if this treatment plan is not working well for you  Follow up in 6 months  or sooner if needed.   Return in about 6 months (around 07/16/2023), or if symptoms worsen or fail to improve.    Thank you for the opportunity to care for this patient.  Please do not hesitate to contact me with questions.  Thermon Leyland, FNP [facility]

## 2023-01-13 NOTE — Patient Instructions (Addendum)
Asthma Moderately well-controlled Continue albuterol 2 puffs once every 4 hours as needed for cough or wheeze You may use albuterol 2 puffs 5 to 15 minutes before activity to decrease cough or wheeze  Allergic rhinitis Well-controlled Continue allergen avoidance measures directed toward grass pollens, weed pollens, tree pollens, dog, molds, cockroach, dust mites, and cat  Continue levocetirizine 5 mg once a day as needed for runny nose or itch. Remember to rotate to a different antihistamine about every 3 months. Some examples of over the counter antihistamines include Zyrtec (cetirizine), Xyzal (levocetirizine), Allegra (fexofenadine), and Claritin (loratidine).  Consider Flonase 1 to 2 sprays in each nostril once a day as needed for stuffy nose. In the right nostril, point the applicator out toward the right ear. In the left nostril, point the applicator out toward the left ear Consider saline nasal rinses as needed for nasal symptoms. Use this before any medicated nasal sprays for best result  Reflux Moderately well-controlled Continue dietary and lifestyle modifications as listed below If you begin to experience heartburn, begin famotidine 20 mg twice a day to control reflux.   Hives Stable Use the least amount of medications while remaining hive free Levocetirizine (Xyzal) 5 mg twice a day and famotidine (Pepcid) 20 mg twice a day. If no symptoms for 7-14 days then decrease to. Levocetirizine (Xyzal) 5 mg twice a day and famotidine (Pepcid) 20 mg once a day.  If no symptoms for 7-14 days then decrease to. Levocetirizine (Xyzal) 5 mg twice a day.  If no symptoms for 7-14 days then decrease to. Levocetirizine (Xyzal) 5 mg once a day.  Continue Xolair injections 300 mg once every 28 days and have access to an epinephrine auto-injector set per protocol  Food allergy Well-controlled Continue to avoid crawfish and the one other fish that caused a reaction. In case of an allergic reaction,  take Benadryl 50 mg every 4 hours, and if life-threatening symptoms occur, inject with AuviQ 0.3 mg.  Call the clinic if this treatment plan is not working well for you  Follow up in 6 months or sooner if needed.  Reducing Pollen Exposure The American Academy of Allergy, Asthma and Immunology suggests the following steps to reduce your exposure to pollen during allergy seasons. Do not hang sheets or clothing out to dry; pollen may collect on these items. Do not mow lawns or spend time around freshly cut grass; mowing stirs up pollen. Keep windows closed at night.  Keep car windows closed while driving. Minimize morning activities outdoors, a time when pollen counts are usually at their highest. Stay indoors as much as possible when pollen counts or humidity is high and on windy days when pollen tends to remain in the air longer. Use air conditioning when possible.  Many air conditioners have filters that trap the pollen spores. Use a HEPA room air filter to remove pollen form the indoor air you breathe.  Control of Mold Allergen Mold and fungi can grow on a variety of surfaces provided certain temperature and moisture conditions exist.  Outdoor molds grow on plants, decaying vegetation and soil.  The major outdoor mold, Alternaria and Cladosporium, are found in very high numbers during hot and dry conditions.  Generally, a late Summer - Fall peak is seen for common outdoor fungal spores.  Rain will temporarily lower outdoor mold spore count, but counts rise rapidly when the rainy period ends.  The most important indoor molds are Aspergillus and Penicillium.  Dark, humid and poorly ventilated basements  are ideal sites for mold growth.  The next most common sites of mold growth are the bathroom and the kitchen.  Outdoor [employer] Use air conditioning and keep windows closed Avoid exposure to decaying vegetation. Avoid leaf raking. Avoid grain handling. Consider wearing a face mask if working  in moldy areas.  Indoor Mold Control Maintain humidity below 50%. Clean washable surfaces with 5% bleach solution. Remove sources e.g. Contaminated carpets.   Control of Dust Mite Allergen Dust mites play a major role in allergic asthma and rhinitis. They occur in environments with high humidity wherever human skin is found. Dust mites absorb humidity from the atmosphere (ie, they do not drink) and feed on organic matter (including shed human and animal skin). Dust mites are a microscopic type of insect that you cannot see with the naked eye. High levels of dust mites have been detected from mattresses, pillows, carpets, upholstered furniture, bed covers, clothes, soft toys and any woven material. The principal allergen of the dust mite is found in its feces. A gram of dust may contain 1,000 mites and 250,000 fecal particles. Mite antigen is easily measured in the air during house cleaning activities. Dust mites do not bite and do not cause harm to humans, other than by triggering allergies/asthma.  Ways to decrease your exposure to dust mites in your home:  1. Encase mattresses, box springs and pillows with a mite-impermeable barrier or cover  2. Wash sheets, blankets and drapes weekly in hot water (130 F) with detergent and dry them in a dryer on the hot setting.  3. Have the room cleaned frequently with a vacuum cleaner and a damp dust-mop. For carpeting or rugs, vacuuming with a vacuum cleaner equipped with a high-efficiency particulate air (HEPA) filter. The dust mite allergic individual should not be in a room which is being cleaned and should wait 1 hour after cleaning before going into the room.  4. Do not sleep on upholstered furniture (eg, couches).  5. If possible removing carpeting, upholstered furniture and drapery from the home is ideal. Horizontal blinds should be eliminated in the rooms where the person spends the most time (bedroom, study, television room). Washable vinyl,  roller-type shades are optimal.  6. Remove all non-washable stuffed toys from the bedroom. Wash stuffed toys weekly like sheets and blankets above.  7. Reduce indoor humidity to less than 50%. Inexpensive humidity monitors can be purchased at most hardware stores. Do not use a humidifier as can make the problem worse and are not recommended.  Control of Dog or Cat Allergen Avoidance is the best way to manage a dog or cat allergy. If you have a dog or cat and are allergic to dog or cats, consider removing the dog or cat from the home. If you have a dog or cat but don't want to find it a new home, or if your family wants a pet even though someone in the household is allergic, here are some strategies that may help keep symptoms at bay:  Keep the pet out of your bedroom and restrict it to only a few rooms. Be advised that keeping the dog or cat in only one room will not limit the allergens to that room. Don't pet, hug or kiss the dog or cat; if you do, wash your hands with soap and water. High-efficiency particulate air (HEPA) cleaners run continuously in a bedroom or living room can reduce allergen levels over time. Regular use of a high-efficiency vacuum cleaner or a  central vacuum can reduce allergen levels. Giving your dog or cat a bath at least once a week can reduce airborne allergen.  Control of Cockroach Allergen Cockroach allergen has been identified as an important cause of acute attacks of asthma, especially in urban settings.  There are fifty-five species of cockroach that exist in the [place], however only three, the [place], [place] species produce allergen that can affect patients with Asthma.  Allergens can be obtained from fecal particles, egg casings and secretions from cockroaches.    Remove food sources. Reduce access to water. Seal access and entry points. Spray runways with 0.5-1% Diazinon or Chlorpyrifos Blow boric acid power under stoves and  refrigerator. Place bait stations (hydramethylnon) at feeding sites.

## 2023-01-16 ENCOUNTER — Ambulatory Visit (INDEPENDENT_AMBULATORY_CARE_PROVIDER_SITE_OTHER): Payer: BC Managed Care – PPO

## 2023-01-16 DIAGNOSIS — L501 Idiopathic urticaria: Secondary | ICD-10-CM

## 2023-02-10 ENCOUNTER — Ambulatory Visit (INDEPENDENT_AMBULATORY_CARE_PROVIDER_SITE_OTHER): Payer: BC Managed Care – PPO

## 2023-02-10 DIAGNOSIS — L501 Idiopathic urticaria: Secondary | ICD-10-CM | POA: Diagnosis not present

## 2023-03-10 ENCOUNTER — Ambulatory Visit: Payer: BC Managed Care – PPO

## 2023-03-10 DIAGNOSIS — L501 Idiopathic urticaria: Secondary | ICD-10-CM | POA: Diagnosis not present

## 2023-03-16 ENCOUNTER — Encounter: Payer: Self-pay | Admitting: Family Medicine

## 2023-03-16 ENCOUNTER — Telehealth: Payer: Self-pay | Admitting: Family Medicine

## 2023-03-16 NOTE — Telephone Encounter (Signed)
Pt. States he is stopping Xolair, he is having skin issues

## 2023-03-16 NOTE — Telephone Encounter (Signed)
NOTED

## 2023-03-18 NOTE — Telephone Encounter (Signed)
Thank you :)

## 2023-03-24 ENCOUNTER — Other Ambulatory Visit: Payer: Self-pay

## 2023-03-24 DIAGNOSIS — J4520 Mild intermittent asthma, uncomplicated: Secondary | ICD-10-CM

## 2023-03-24 MED ORDER — ALBUTEROL SULFATE HFA 108 (90 BASE) MCG/ACT IN AERS: 2.0000 | INHALATION_SPRAY | RESPIRATORY_TRACT | 1 refills | Status: AC | PRN

## 2023-04-01 ENCOUNTER — Other Ambulatory Visit: Payer: Self-pay

## 2023-04-01 DIAGNOSIS — J4520 Mild intermittent asthma, uncomplicated: Secondary | ICD-10-CM

## 2023-04-01 MED ORDER — LEVALBUTEROL TARTRATE 45 MCG/ACT IN AERO: 2.0000 | INHALATION_SPRAY | RESPIRATORY_TRACT | 1 refills | Status: AC | PRN

## 2023-04-08 ENCOUNTER — Ambulatory Visit: Payer: BC Managed Care – PPO

## 2023-04-13 ENCOUNTER — Other Ambulatory Visit: Payer: Self-pay | Admitting: Internal Medicine

## 2023-07-28 ENCOUNTER — Ambulatory Visit: Payer: BC Managed Care – PPO | Admitting: Family Medicine
